# Patient Record
Sex: Male | Born: 1985 | Race: White | Hispanic: No | Marital: Single | Smoking: Current every day smoker
Health system: Southern US, Community
[De-identification: ages and names within clinical notes are randomized; demographics above are authoritative.]

## PROBLEM LIST (undated history)

## (undated) HISTORY — PX: TOOTH EXTRACTION: SUR596

---
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
## 2004-11-21 ENCOUNTER — Emergency Department (HOSPITAL_COMMUNITY): Admission: EM | Admit: 2004-11-21 | Discharge: 2004-11-21 | Payer: Self-pay | Admitting: Emergency Medicine

## 2005-03-15 ENCOUNTER — Emergency Department (HOSPITAL_COMMUNITY): Admission: EM | Admit: 2005-03-15 | Discharge: 2005-03-15 | Payer: Self-pay | Admitting: Emergency Medicine

## 2006-05-31 ENCOUNTER — Emergency Department (HOSPITAL_COMMUNITY): Admission: EM | Admit: 2006-05-31 | Discharge: 2006-05-31 | Payer: Self-pay | Admitting: Emergency Medicine

## 2010-02-26 ENCOUNTER — Emergency Department (HOSPITAL_COMMUNITY): Admission: EM | Admit: 2010-02-26 | Discharge: 2010-02-26 | Payer: Self-pay | Admitting: Emergency Medicine

## 2010-03-13 ENCOUNTER — Emergency Department (HOSPITAL_COMMUNITY): Admission: EM | Admit: 2010-03-13 | Discharge: 2010-03-13 | Payer: Self-pay | Admitting: Emergency Medicine

## 2010-04-02 ENCOUNTER — Emergency Department (HOSPITAL_COMMUNITY): Admission: EM | Admit: 2010-04-02 | Discharge: 2010-04-02 | Payer: Self-pay | Admitting: Emergency Medicine

## 2011-04-22 ENCOUNTER — Inpatient Hospital Stay (INDEPENDENT_AMBULATORY_CARE_PROVIDER_SITE_OTHER)
Admission: RE | Admit: 2011-04-22 | Discharge: 2011-04-22 | Disposition: A | Discharge status: Home / Self Care | Payer: Self-pay | Source: Ambulatory Visit | Attending: Emergency Medicine | Admitting: Emergency Medicine

## 2011-04-22 DIAGNOSIS — J020 Streptococcal pharyngitis: Secondary | ICD-10-CM

## 2011-04-22 LAB — POCT RAPID STREP A: Streptococcus, Group A Screen (Direct): POSITIVE — AB

## 2011-07-02 ENCOUNTER — Emergency Department (INDEPENDENT_AMBULATORY_CARE_PROVIDER_SITE_OTHER)
Admission: EM | Admit: 2011-07-02 | Discharge: 2011-07-02 | Disposition: A | Discharge status: Home / Self Care | Payer: BC Managed Care – PPO | Source: Home / Self Care | Attending: Emergency Medicine | Admitting: Emergency Medicine

## 2011-07-02 ENCOUNTER — Encounter: Payer: Self-pay | Admitting: Emergency Medicine

## 2011-07-02 DIAGNOSIS — R197 Diarrhea, unspecified: Secondary | ICD-10-CM

## 2011-07-02 LAB — DIFFERENTIAL
Basophils Absolute: 0 10*3/uL (ref 0.0–0.1)
Basophils Relative: 0 % (ref 0–1)
Eosinophils Absolute: 0.2 10*3/uL (ref 0.0–0.7)
Eosinophils Relative: 3 % (ref 0–5)

## 2011-07-02 LAB — CBC
MCH: 31.4 pg (ref 26.0–34.0)
MCV: 88.2 fL (ref 78.0–100.0)
Platelets: 221 10*3/uL (ref 150–400)
RDW: 12.7 % (ref 11.5–15.5)

## 2011-07-02 LAB — POCT I-STAT, CHEM 8
Hemoglobin: 17.3 g/dL — ABNORMAL HIGH (ref 13.0–17.0)
Sodium: 140 mEq/L (ref 135–145)
TCO2: 24 mmol/L (ref 0–100)

## 2011-07-02 MED ORDER — DIPHENOXYLATE-ATROPINE 2.5-0.025 MG PO TABS: 1.0000 | ORAL_TABLET | Freq: Four times a day (QID) | ORAL | Status: AC | PRN

## 2011-07-02 MED ORDER — ONDANSETRON HCL 4 MG PO TABS: 4.0000 mg | ORAL_TABLET | Freq: Three times a day (TID) | ORAL | Status: AC | PRN

## 2011-07-02 MED ORDER — ONDANSETRON 4 MG PO TBDP
ORAL_TABLET | ORAL | Status: AC
  Filled 2011-07-02: qty 1

## 2011-07-02 MED ORDER — ONDANSETRON HCL 4 MG PO TABS
4.0000 mg | ORAL_TABLET | Freq: Once | ORAL | Status: AC
  Administered 2011-07-02: 4 mg via ORAL

## 2011-07-02 NOTE — ED Provider Notes (Addendum)
History     CSN: [id number] Arrival date & time: 07/02/2011  2:17 PM   First MD Initiated Contact with Patient 07/02/11 1339      Chief Complaint  Patient presents with  . Fever    (Consider location/radiation/quality/duration/timing/severity/associated sxs/prior treatment) HPI Comments: For almost 6 days been nauseous with low grade fevers, vomited x 1, diarrheas more than 3 episodes of liquid diarrheas per day, and feeling nauseous  How many days, off Im a getting?  Patient is a 25 y.o. male presenting with diarrhea. The history is provided by the patient.  Diarrhea The primary symptoms include fever, abdominal pain, nausea and diarrhea. Primary symptoms do not include dysuria or rash. The illness began 3 to 5 days ago. The problem has been gradually improving.  The illness is also significant for anorexia.    History reviewed. No pertinent past medical history.  History reviewed. No pertinent past surgical history.  Family History  Problem Relation Age of Onset  . Diabetes Mother   . Cancer Mother   . Emphysema Father   . Cirrhosis Father     History  Substance Use Topics  . Smoking status: Current Everyday Smoker  . Smokeless tobacco: Not on file  . Alcohol Use: Yes     ocassionally      Review of Systems  Constitutional: Positive for fever.  Respiratory: Negative for cough.   Gastrointestinal: Positive for nausea, abdominal pain, diarrhea and anorexia.  Genitourinary: Negative for dysuria.  Skin: Negative for rash.    Allergies  Review of patient's allergies indicates no known allergies.  Home Medications   Current Outpatient Rx  Name Route Sig Dispense Refill  . ALEVE PO Oral Take 2 tablets by mouth 1 day or 1 dose.        BP 119/72  Pulse 90  Temp(Src) 97.8 F (36.6 C) (Oral)  Resp 20  SpO2 97%  Physical Exam  Constitutional: He appears well-developed and well-nourished.  Non-toxic appearance. He does not have a sickly appearance. He  does not appear ill. No distress.  HENT:  Mouth/Throat: Uvula is midline.  Eyes: No scleral icterus.  Neck: Normal range of motion.  Pulmonary/Chest: Effort normal.  Abdominal: Soft. Bowel sounds are normal. There is no tenderness. There is no rebound, no CVA tenderness, no tenderness at McBurney's point and negative Murphy's sign.  Skin: Skin is warm. No rash noted.    ED Course  Procedures (including critical care time)  Labs Reviewed  POCT I-STAT, CHEM 8 - Abnormal; Notable for the following:    Hemoglobin 17.3 (*)    All other components within normal limits  CBC  DIFFERENTIAL  I-STAT, CHEM 8   No results found.   No diagnosis found.    MDM  Viral syndrome-        Jimmie Molly, MD 07/02/11 1601  Jimmie Molly, MD 07/03/11 9528  Jimmie Molly, MD 07/03/11 [phone]

## 2011-07-02 NOTE — ED Notes (Signed)
Fever, intermittent, general aches and pain.  Reports chills, feeling bad, nausea, very rare vomiting, diarrhea.  3 diarrhea stools today, double that number yesterday. Denies vomiting today.

## 2012-04-25 ENCOUNTER — Emergency Department (HOSPITAL_COMMUNITY): Payer: BC Managed Care – PPO

## 2012-04-25 ENCOUNTER — Encounter (HOSPITAL_COMMUNITY): Payer: Self-pay | Admitting: Family Medicine

## 2012-04-25 ENCOUNTER — Emergency Department (HOSPITAL_COMMUNITY)
Admission: EM | Admit: 2012-04-25 | Discharge: 2012-04-25 | Disposition: A | Discharge status: Home Health | Payer: BC Managed Care – PPO | Attending: Emergency Medicine | Admitting: Emergency Medicine

## 2012-04-25 DIAGNOSIS — R079 Chest pain, unspecified: Secondary | ICD-10-CM

## 2012-04-25 DIAGNOSIS — F17200 Nicotine dependence, unspecified, uncomplicated: Secondary | ICD-10-CM | POA: Insufficient documentation

## 2012-04-25 DIAGNOSIS — J40 Bronchitis, not specified as acute or chronic: Secondary | ICD-10-CM | POA: Insufficient documentation

## 2012-04-25 LAB — CBC WITH DIFFERENTIAL/PLATELET
Eosinophils Absolute: 0.1 10*3/uL (ref 0.0–0.7)
Lymphocytes Relative: 8 % — ABNORMAL LOW (ref 12–46)
Lymphs Abs: 0.8 10*3/uL (ref 0.7–4.0)
MCH: 30.7 pg (ref 26.0–34.0)
Neutrophils Relative %: 84 % — ABNORMAL HIGH (ref 43–77)
Platelets: 223 10*3/uL (ref 150–400)
RBC: 5.08 MIL/uL (ref 4.22–5.81)
WBC: 10.1 10*3/uL (ref 4.0–10.5)

## 2012-04-25 LAB — POCT I-STAT, CHEM 8
BUN: 9 mg/dL (ref 6–23)
HCT: 48 % (ref 39.0–52.0)
Sodium: 140 mEq/L (ref 135–145)
TCO2: 24 mmol/L (ref 0–100)

## 2012-04-25 LAB — D-DIMER, QUANTITATIVE: D-Dimer, Quant: <0.27 ug/mL-FEU (ref 0.00–0.48)

## 2012-04-25 MED ORDER — ALBUTEROL SULFATE (5 MG/ML) 0.5% IN NEBU
5.0000 mg | INHALATION_SOLUTION | Freq: Once | RESPIRATORY_TRACT | Status: AC
  Administered 2012-04-25: 5 mg via RESPIRATORY_TRACT
  Filled 2012-04-25: qty 1

## 2012-04-25 MED ORDER — PREDNISONE 10 MG PO TABS: 40.0000 mg | ORAL_TABLET | Freq: Every day | ORAL | Status: DC

## 2012-04-25 MED ORDER — IPRATROPIUM BROMIDE 0.02 % IN SOLN
0.5000 mg | Freq: Once | RESPIRATORY_TRACT | Status: AC
  Administered 2012-04-25: 0.5 mg via RESPIRATORY_TRACT
  Filled 2012-04-25: qty 2.5

## 2012-04-25 MED ORDER — ALBUTEROL SULFATE HFA 108 (90 BASE) MCG/ACT IN AERS
2.0000 | INHALATION_SPRAY | Freq: Once | RESPIRATORY_TRACT | Status: AC
  Administered 2012-04-25: 2 via RESPIRATORY_TRACT
  Filled 2012-04-25: qty 6.7

## 2012-04-25 MED ORDER — IBUPROFEN 600 MG PO TABS: 600.0000 mg | ORAL_TABLET | Freq: Four times a day (QID) | ORAL | Status: AC | PRN

## 2012-04-25 MED ORDER — IBUPROFEN 600 MG PO TABS: 600.0000 mg | ORAL_TABLET | Freq: Four times a day (QID) | ORAL | Status: DC | PRN

## 2012-04-25 NOTE — ED Notes (Signed)
Pt sts SOB and hard to take a deep breath. sts chest and back pain. Denies cough or fever. No sure of any injury
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
## 2012-04-26 NOTE — ED Provider Notes (Signed)
Medical screening examination/treatment/procedure(s) were performed by non-physician practitioner and as supervising physician I was immediately available for consultation/collaboration.   Carleene Cooper III, MD 04/26/12 [phone]

## 2012-12-08 ENCOUNTER — Emergency Department (HOSPITAL_COMMUNITY)
Admission: EM | Admit: 2012-12-08 | Discharge: 2012-12-08 | Disposition: A | Discharge status: Home / Self Care | Payer: Worker's Compensation | Attending: Emergency Medicine | Admitting: Emergency Medicine

## 2012-12-08 ENCOUNTER — Emergency Department (HOSPITAL_COMMUNITY): Payer: Worker's Compensation

## 2012-12-08 ENCOUNTER — Encounter (HOSPITAL_COMMUNITY): Payer: Self-pay | Admitting: Emergency Medicine

## 2012-12-08 DIAGNOSIS — F17200 Nicotine dependence, unspecified, uncomplicated: Secondary | ICD-10-CM | POA: Insufficient documentation

## 2012-12-08 DIAGNOSIS — W298XXA Contact with other powered powered hand tools and household machinery, initial encounter: Secondary | ICD-10-CM | POA: Insufficient documentation

## 2012-12-08 DIAGNOSIS — Y9389 Activity, other specified: Secondary | ICD-10-CM | POA: Insufficient documentation

## 2012-12-08 DIAGNOSIS — S81012A Laceration without foreign body, left knee, initial encounter: Secondary | ICD-10-CM

## 2012-12-08 DIAGNOSIS — S81809A Unspecified open wound, unspecified lower leg, initial encounter: Secondary | ICD-10-CM | POA: Insufficient documentation

## 2012-12-08 DIAGNOSIS — S81009A Unspecified open wound, unspecified knee, initial encounter: Secondary | ICD-10-CM | POA: Insufficient documentation

## 2012-12-08 DIAGNOSIS — Y990 Civilian activity done for income or pay: Secondary | ICD-10-CM | POA: Insufficient documentation

## 2012-12-08 DIAGNOSIS — Y9289 Other specified places as the place of occurrence of the external cause: Secondary | ICD-10-CM | POA: Insufficient documentation

## 2012-12-08 MED ORDER — CEPHALEXIN 500 MG PO CAPS
500.0000 mg | ORAL_CAPSULE | Freq: Four times a day (QID) | ORAL | Status: DC
Start: 2012-12-08 — End: 2014-09-17

## 2012-12-08 MED ORDER — LIDOCAINE HCL (PF) 1 % IJ SOLN
10.0000 mL | Freq: Once | INTRAMUSCULAR | Status: DC
  Filled 2012-12-08: qty 10

## 2012-12-08 MED ORDER — HYDROCODONE-ACETAMINOPHEN 5-325 MG PO TABS: 1.0000 | ORAL_TABLET | ORAL | Status: DC | PRN

## 2012-12-08 MED ORDER — LIDOCAINE HCL 1 % IJ SOLN: 10.0000 mL | Freq: Once | INTRAMUSCULAR | Status: DC

## 2012-12-08 MED ORDER — TETANUS-DIPHTH-ACELL PERTUSSIS 5-2.5-18.5 LF-MCG/0.5 IM SUSP
0.5000 mL | Freq: Once | INTRAMUSCULAR | Status: AC
  Administered 2012-12-08: 0.5 mL via INTRAMUSCULAR
  Filled 2012-12-08: qty 0.5

## 2012-12-08 NOTE — ED Notes (Signed)
Pt was "cutting up a tree and the chainsaw kicked back and cut me" Bleeding controlled at this time.

## 2012-12-08 NOTE — ED Provider Notes (Signed)
History     CSN: [id number]  Arrival date & time 12/08/12  1659   First MD Initiated Contact with Patient 12/08/12 1721      Chief Complaint  Patient presents with  . Extremity Laceration    (Consider location/radiation/quality/duration/timing/severity/associated sxs/prior treatment) The history is provided by the patient and medical records. No language interpreter was used.    Zachary Lozano is a 27 y.o. male  with no medical Hx presents to the Emergency Department complaining of acute persistent laceration to the L knee while cutting a tree with a chainsaw.  Pt was at work when this happened at approx 4:50pm.  Pt put a tourniquet on his leg which stopped the bleeding.  Bending the knee makes the bleeding worse, but he denies pain in the extremity.  Pt was ambulatory without difficulty after the accident.  Associated symptoms include bleeding.  Denies fever, chills, headache, neck pain, numbness, tingling.  Pt last tetanus is unknown.    History reviewed. No pertinent past medical history.  Past Surgical History  Procedure Laterality Date  . Tooth extraction      Family History  Problem Relation Age of Onset  . Diabetes Mother   . Cancer Mother   . Emphysema Father   . Cirrhosis Father     History  Substance Use Topics  . Smoking status: Current Every Day Smoker -- 1.00 packs/day    Types: Cigarettes  . Smokeless tobacco: Not on file  . Alcohol Use: No     Comment: ocassionally      Review of Systems  Constitutional: Negative for fever.  HENT: Negative for neck pain and neck stiffness.   Musculoskeletal: Negative for back pain and gait problem.  Skin: Positive for wound.  Allergic/Immunologic: Negative for immunocompromised state.  Neurological: Negative for weakness and numbness.  Hematological: Does not bruise/bleed easily.  Psychiatric/Behavioral: The patient is not nervous/anxious.     Allergies  Review of patient's allergies indicates no known  allergies.  Home Medications   Current Outpatient Rx  Name  Route  Sig  Dispense  Refill  . ibuprofen (ADVIL,MOTRIN) 200 MG tablet   Oral   Take 200 mg by mouth every 6 (six) hours as needed for pain.         . cephALEXin (KEFLEX) 500 MG capsule   Oral   Take 1 capsule (500 mg total) by mouth 4 (four) times daily.   40 capsule   0   . HYDROcodone-acetaminophen (NORCO/VICODIN) 5-325 MG per tablet   Oral   Take 1 tablet by mouth every 4 (four) hours as needed for pain.   6 tablet   0     BP 121/67  Pulse 100  Temp(Src) 98.4 F (36.9 C) (Oral)  SpO2 97%  Physical Exam  Nursing note and vitals reviewed. Constitutional: He is oriented to person, place, and time. He appears well-developed and well-nourished. No distress.  HENT:  Head: Normocephalic and atraumatic.  Eyes: Conjunctivae are normal. No scleral icterus.  Neck: Normal range of motion.  Cardiovascular: Normal rate, regular rhythm, normal heart sounds and intact distal pulses.   No murmur heard. Capillary refill < 3 sec  Pulmonary/Chest: Effort normal and breath sounds normal. No respiratory distress. He has no wheezes.  Abdominal: Soft. He exhibits no distension. There is no tenderness.  Musculoskeletal: Normal range of motion. He exhibits no edema.  Full ROM of the L knee and LLE No evidence of patella tendon defect  Neurological: He is alert  and oriented to person, place, and time. He exhibits normal muscle tone. Coordination normal.  Sensation intact to dull and sharp Strength 5/5 in the bilateral lower extremities  Skin: Skin is warm and dry. He is not diaphoretic. No erythema.  10cm, Large laceration noted over the L knee with an accompanying smaller laceration   Psychiatric: He has a normal mood and affect.    ED Course  LACERATION REPAIR Date/Time: 12/08/2012 6:26 PM Performed by: Dierdre Forth Authorized by: Dierdre Forth Consent: Verbal consent obtained. Risks and benefits:  risks, benefits and alternatives were discussed Consent given by: patient Patient understanding: patient states understanding of the procedure being performed Patient consent: the patient's understanding of the procedure matches consent given Procedure consent: procedure consent matches procedure scheduled Relevant documents: relevant documents present and verified Site marked: the operative site was marked Required items: required blood products, implants, devices, and special equipment available Patient identity confirmed: verbally with patient and arm band Time out: Immediately prior to procedure a "time out" was called to verify the correct patient, procedure, equipment, support staff and site/side marked as required. Body area: lower extremity Location details: left knee Laceration length: 10 cm Contamination: The wound is contaminated. Foreign bodies: wood Tendon involvement: none Nerve involvement: none Vascular damage: no Anesthesia: local infiltration Local anesthetic: lidocaine 1% without epinephrine Anesthetic total: 12 ml Patient sedated: no Preparation: Patient was prepped and draped in the usual sterile fashion. Irrigation solution: saline Irrigation method: syringe and jet lavage Amount of cleaning: extensive Debridement: none Degree of undermining: none Skin closure: 3-0 Prolene Number of sutures: 10 (7 horizontal mattress and 3 simple interrupted) Technique: simple and horizontal mattress Approximation: close Approximation difficulty: complex Dressing: 4x4 sterile gauze Patient tolerance: Patient tolerated the procedure well with no immediate complications.   (including critical care time)  Labs Reviewed - No data to display Dg Knee Complete 4 Views Left  12/08/2012  *RADIOLOGY REPORT*  Clinical Data: Chainsaw laceration.  LEFT KNEE - COMPLETE 4+ VIEW  Comparison: None.  Findings: There is a laceration involving the ventral aspect of the knee near the inferior  pole of the patella.  No obvious fracture. The patellar tendon appears intact.  No joint effusion.  IMPRESSION: Deep laceration but no plain film findings for patellar fracture or patellar tendon disruption.   Original Report Authenticated By: Rudie Meyer, M.D.      1. Laceration of knee, left, complicated, initial encounter       MDM  [name] presents after laceration from chainsaw accident.  Tdap booster given. No fracture noted on x-ray.  I personally reviewed the imaging tests through PACS system.  I reviewed available ER/hospitalization records through the EMR.  Pressure irrigation performed. Laceration occurred < 8 hours prior to repair which was well tolerated. Pt has no co morbidities to effect normal wound healing. Discussed suture home care w pt and answered questions. Pt placed in knee immobilizer to decrease ROM of knee and keep stitches intact.   Pt to f-u for wound check and suture removal in 7 days. Pt is hemodynamically stable w no complaints prior to dc.           Dahlia Client Wandalene Abrams, PA-C 12/08/12 1932

## 2012-12-08 NOTE — ED Provider Notes (Signed)
Medical screening examination/treatment/procedure(s) were performed by non-physician practitioner and as supervising physician I was immediately available for consultation/collaboration.   [place] Joselle Deeds, MD 12/08/12 2154 

## 2014-09-17 ENCOUNTER — Encounter (HOSPITAL_COMMUNITY): Payer: Self-pay | Admitting: Emergency Medicine

## 2014-09-17 ENCOUNTER — Emergency Department (INDEPENDENT_AMBULATORY_CARE_PROVIDER_SITE_OTHER)
Admission: EM | Admit: 2014-09-17 | Discharge: 2014-09-17 | Disposition: A | Discharge status: Home / Self Care | Payer: BLUE CROSS/BLUE SHIELD | Source: Home / Self Care | Attending: Emergency Medicine | Admitting: Emergency Medicine

## 2014-09-17 DIAGNOSIS — K529 Noninfective gastroenteritis and colitis, unspecified: Secondary | ICD-10-CM

## 2014-09-17 LAB — POCT I-STAT, CHEM 8
BUN: 11 mg/dL (ref 6–23)
CREATININE: 0.8 mg/dL (ref 0.50–1.35)
Calcium, Ion: 1.21 mmol/L (ref 1.12–1.23)
Chloride: 106 mmol/L (ref 96–112)
GLUCOSE: 98 mg/dL (ref 70–99)
HEMATOCRIT: 50 % (ref 39.0–52.0)
HEMOGLOBIN: 17 g/dL (ref 13.0–17.0)
POTASSIUM: 4.1 mmol/L (ref 3.5–5.1)
SODIUM: 141 mmol/L (ref 135–145)
TCO2: 19 mmol/L (ref 0–100)

## 2014-09-17 MED ORDER — ONDANSETRON HCL 4 MG PO TABS: 4.0000 mg | ORAL_TABLET | Freq: Three times a day (TID) | ORAL | Status: DC | PRN

## 2014-09-17 MED ORDER — LOPERAMIDE HCL 2 MG PO CAPS: 2.0000 mg | ORAL_CAPSULE | Freq: Four times a day (QID) | ORAL | Status: DC | PRN

## 2014-09-17 NOTE — ED Notes (Signed)
Pt states that he has had episodes on and off of nausea and diarrhea for a few weeks now along with fatigue.

## 2014-09-17 NOTE — ED Provider Notes (Signed)
CSN: [id number]     Arrival date & time 09/17/14  1809 History   First MD Initiated Contact with Patient 09/17/14 1914     Chief Complaint  Patient presents with  . Emesis  . Diarrhea   (Consider location/radiation/quality/duration/timing/severity/associated sxs/prior Treatment) HPI He is a 29 year old man here for evaluation of vomiting and diarrhea. He states for the last month he has had recurrent episodes of nausea, vomiting, crampy abdominal pain, diarrhea. It will temporarily improved, but then recurred. Today, he reports a small amount of bright red blood in the stool. This is associated with some discomfort with bowel movements. He denies any fevers or chills. He describes the pain as crampy pain, primarily on the left side. He also reports decreased appetite and fatigue. Sometimes he will have bloating associated with these episodes. They are not associated with any specific foods. No family history of ulcerative colitis or Crohn's. No history of colon cancer. He is a smoker. He denies alcohol use.  History reviewed. No pertinent past medical history. Past Surgical History  Procedure Laterality Date  . Tooth extraction     Family History  Problem Relation Age of Onset  . Diabetes Mother   . Cancer Mother   . Emphysema Father   . Cirrhosis Father    History  Substance Use Topics  . Smoking status: Current Every Day Smoker -- 1.00 packs/day    Types: Cigarettes  . Smokeless tobacco: Not on file  . Alcohol Use: No     Comment: ocassionally    Review of Systems  Constitutional: Positive for fever and appetite change. Negative for chills.  Gastrointestinal: Positive for nausea, vomiting, abdominal pain, diarrhea and blood in stool.  Musculoskeletal: Negative for myalgias and arthralgias.  Skin: Negative for rash.    Allergies  Review of patient's allergies indicates no known allergies.  Home Medications   Prior to Admission medications   Medication Sig Start Date End  Date Taking? Authorizing Provider  loperamide (IMODIUM) 2 MG capsule Take 1 capsule (2 mg total) by mouth 4 (four) times daily as needed for diarrhea or loose stools. 09/17/14   Charm RingsErin J Lux Meaders, MD  ondansetron (ZOFRAN) 4 MG tablet Take 1 tablet (4 mg total) by mouth every 8 (eight) hours as needed for nausea or vomiting. 09/17/14   Charm RingsErin J Azazel Franze, MD   BP 116/74 mmHg  Pulse 89  Temp(Src) 98.7 F (37.1 C) (Oral)  Resp 18  SpO2 97% Physical Exam  Constitutional: He is oriented to person, place, and time. He appears well-developed and well-nourished. No distress.  HENT:  Mouth/Throat: Oropharynx is clear and moist.  Eyes: Conjunctivae are normal.  Neck: Neck supple.  Cardiovascular: Normal rate, regular rhythm and normal heart sounds.   No murmur heard. Pulmonary/Chest: Effort normal and breath sounds normal. No respiratory distress. He has no wheezes. He has no rales.  Abdominal: Soft. Bowel sounds are normal. He exhibits no distension. There is tenderness (on the left). There is no rebound and no guarding.  Genitourinary: Rectal exam shows external hemorrhoid. Rectal exam shows no fissure, no mass, no tenderness and anal tone normal. Guaiac negative stool.     Neurological: He is alert and oriented to person, place, and time.  Skin: No rash noted.    ED Course  Procedures (including critical care time) Labs Review Labs Reviewed  POCT I-STAT, CHEM 8    Imaging Review No results found.   MDM   1. Gastroenteritis    Fecal occult test is  negative.  Unclear cause of diarrhea. We'll treat symptomatically with Zofran and Imodium. Recommended follow-up with gastroenterology if symptoms persist. Reviewed reasons to go to the ER as in after visit summary.   Charm Rings, MD 09/17/14 2040

## 2014-09-17 NOTE — Discharge Instructions (Signed)
I am not sure what is causing your diarrhea. It may be recurrent viral infections. Take Zofran every 8 hours as needed for nausea and vomiting. You can take Imodium 4 times a day as needed for diarrhea. If your symptoms have not resolved in the next week, please follow-up with gastroenterology. A few see blood in your stool or vomit, or are unable to keep fluids down, or develop fevers, please go to the emergency room.

## 2014-10-24 ENCOUNTER — Encounter (HOSPITAL_COMMUNITY): Payer: Self-pay | Admitting: *Deleted

## 2014-10-24 ENCOUNTER — Emergency Department (HOSPITAL_COMMUNITY)
Admission: EM | Admit: 2014-10-24 | Discharge: 2014-10-24 | Disposition: A | Discharge status: Home / Self Care | Payer: BLUE CROSS/BLUE SHIELD | Attending: Emergency Medicine | Admitting: Emergency Medicine

## 2014-10-24 DIAGNOSIS — M542 Cervicalgia: Secondary | ICD-10-CM | POA: Diagnosis not present

## 2014-10-24 DIAGNOSIS — Z720 Tobacco use: Secondary | ICD-10-CM | POA: Diagnosis not present

## 2014-10-24 DIAGNOSIS — M545 Low back pain: Secondary | ICD-10-CM | POA: Insufficient documentation
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
## 2014-10-24 NOTE — Discharge Instructions (Signed)
Back Exercises °Back exercises help treat and prevent back injuries. The goal is to increase your strength in your belly (abdominal) and back muscles. These exercises can also help with flexibility. Start these exercises when told by your doctor. °HOME CARE °Back exercises include: °Pelvic Tilt. °· Lie on your back with your knees bent. Tilt your pelvis until the lower part of your back is against the floor. Hold this position 5 to 10 sec. Repeat this exercise 5 to 10 times. °Knee to Chest. °· Pull 1 knee up against your chest and hold for 20 to 30 seconds. Repeat this with the other knee. This may be done with the other leg straight or bent, whichever feels better. Then, pull both knees up against your chest. °Sit-Ups or Curl-Ups. °· Bend your knees 90 degrees. Start with tilting your pelvis, and do a partial, slow sit-up. Only lift your upper half 30 to 45 degrees off the floor. Take at least 2 to 3 seonds for each sit-up. Do not do sit-ups with your knees out straight. If partial sit-ups are difficult, simply do the above but with only tightening your belly (abdominal) muscles and holding it as told. °Hip-Lift. °· Lie on your back with your knees flexed 90 degrees. Push down with your feet and shoulders as you raise your hips 2 inches off the floor. Hold for 10 seconds, repeat 5 to 10 times. °Back Arches. °· Lie on your stomach. Prop yourself up on bent elbows. Slowly press on your hands, causing an arch in your low back. Repeat 3 to 5 times. °Shoulder-Lifts. °· Lie face down with arms beside your body. Keep hips and belly pressed to floor as you slowly lift your head and shoulders off the floor. °Do not overdo your exercises. Be careful in the beginning. Exercises may cause you some mild back discomfort. If the pain lasts for more than 15 minutes, stop the exercises until you see your doctor. Improvement with exercise for back problems is slow.  °Document Released: 09/05/2010 Document Revised: 10/26/2011  Document Reviewed: 06/04/2011 °ExitCare® Patient Information ©2015 [employer]. This information is not intended to replace advice given to you by your health care provider. Make sure you discuss any questions you have with your health care provider. ° °Emergency Department Resource Guide °1) Find a Doctor and Pay Out of Pocket °Although you won't have to find out who is covered by your insurance plan, it is a good idea to ask around and get recommendations. You will then need to call the office and see if the doctor you have chosen will accept you as a new patient and what types of options they offer for patients who are self-pay. Some doctors offer discounts or will set up payment plans for their patients who do not have insurance, but you will need to ask so you aren't surprised when you get to your appointment. ° °2) Contact Your Local Health Department °Not all health departments have doctors that can see patients for sick visits, but many do, so it is worth a call to see if yours does. If you don't know where your local health department is, you can check in your phone book. The CDC also has a tool to help you locate your state's health department, and many state websites also have listings of all of their local health departments. ° °3) Find a Walk-in Clinic °If your illness is not likely to be very severe or complicated, you may want to try a walk in   clinic. These are popping up all over the country in pharmacies, drugstores, and shopping centers. They're usually staffed by nurse practitioners or physician assistants that have been trained to treat common illnesses and complaints. They're usually fairly quick and inexpensive. However, if you have serious medical issues or chronic medical problems, these are probably not your best option. ° °No Primary Care Doctor: °- Call [facility] at  832-8000 - they can help you locate a primary care doctor that  accepts your insurance, provides certain services,  etc. °- Physician Referral Service- 1-800-533-3463 ° °Chronic Pain Problems: °Organization         Address  Phone   Notes  °[facility]  (336) 297-2271 Patients need to be referred by their primary care doctor.  ° °Medication Assistance: °Organization         Address  Phone   Notes  °[place] [facility] [address] °[place], [place] [address] (336) 641-8030 --Must be a resident of [place] °-- Must have NO insurance coverage whatsoever (no Medicaid/ Medicare, etc.) °-- The pt. MUST have a primary care doctor that directs their care regularly and follows them in the community °  °[facility]  (866) 331-1348   °[employer]  (888) 892-1162   ° °Agencies that provide inexpensive medical care: °Organization         Address  Phone   Notes  °[facility]  (336) 832-8035   °[facility]    (336) 832-7272   °[facility] [address] °[place], [place] [address] (336) 832-4777   °[facility] [address], °[place] (336) 271-4999   °[facility]    (336) 373-0678   °[facility]    (336) 272-1050   °[facility] ° [address], [place] Phone:  (336) 832-4444, Fax:  (336) 832-4440 Hours of Operation:  9 am - 6 pm, M-F.  Also accepts Medicaid/Medicare and self-pay.  °[facility] ° [address], [place] Phone: (336) 832-3150, Fax: (336) 832-3151. Hours of Operation:  8:30 am - 5:30 pm, M-F.  Also accepts Medicaid and self-pay.  °[facility] [place] [address], [place] Phone: (336) 878-6027   °[facility] [address], [place], [place] (336)723-1848, Ext. 123 Mondays & Thursdays: 7-9 AM.  First 15 patients are seen on a first come, first serve basis. °  ° °Medicaid-accepting [place] Providers: ° °Organization         Address  Phone   Notes  °[facility] [address], [place] (336) 641-2100 Also accepts self-pay patients.  °[facility] [address], [place] ° (336) 856-9996   °[facility] [address], [place] (336) 288-8857   °[facility] [address] [place] [address], [place] (336) 299-7000   °Veita Bland [address], [place]  ° (336) 373-1557 Only accepts [place] Access Medicaid patients after they have their name applied to their card.  ° °Self-Pay (no insurance) in [place]: ° °Organization         Address  Phone   Notes  °Sickle Cell Patients, [facility] [address], [place] (336) 832-1970   °[facility] [address],  (336) 832-4400   °[facility] ° [address], [place] (336) 992-4800   °[facility]/Dr. Osei-Bonsu ° [address] [place] [address], [place] or [address], [place] (336) 841-8500 Phone number for both [place] and [place] locations is the same.  °[facility] [address], [place] (336) 299-0000   °[facility] [address] [place] [address], [place] or [address] (336) 852-7530 °(336) 878-2260   °[facility] [address], [place] (336) 350-1642, phone; (336) 294-5005, fax Sees patients 1st and 3rd Saturday of every month.  Must not qualify for public or private insurance (i.e. Medicaid, Medicare, [place] Health Choice, Veterans' Benefits) • Household income should be no more than 200% of the poverty level •The clinic cannot treat you if you are pregnant or think you are pregnant • Sexually transmitted diseases are not treated at the clinic.  ° ° °Dental Care: °Organization         Address  Phone  Notes  °[facility] [address], [place] (336) 641-6152 Accepts children up to  age 21 who are enrolled in Medicaid or [place] Health Choice; pregnant women with a Medicaid card; and children who have applied for Medicaid or [place] Health Choice, but were declined, whose parents can pay a reduced fee at time of service.  °[facility] [place]  [address], [place] (336) 641-7733 Accepts children up to age 21 who are enrolled in Medicaid or [place] Health Choice; pregnant women with a Medicaid card; and children who have applied for Medicaid or [place] Health Choice, but were declined, whose parents can pay a reduced fee at time of service.  °[facility] ° [address],  (336) 641-4533 Patients are seen by appointment only. Walk-ins are not accepted. [facility] will see patients 18 years of age and older. °Monday - Tuesday (8am-5pm) °Most Wednesdays (8:30-5pm) °$30 per visit, cash only  °[facility] ° [address], [place] (336) 641-4533 Patients are seen by appointment only. Walk-ins are not accepted. [facility] will see patients 18 years of age and older. °One Wednesday Evening (Monthly: Volunteer Based).  $30 per visit, cash only  °[facility]  (919) 537-3737 for adults; Children under age 4, call Graduate Pediatric Dentistry at (919) [phone]. Children aged 4-14, please call (919) [phone] to request a pediatric application. ° Dental services are provided in all areas of dental care including fillings, crowns and bridges, complete and partial dentures, implants, gum treatment, root canals, and extractions. Preventive care is also provided. Treatment is provided to both adults and children. °Patients are selected via a lottery and there is often a waiting list. °  °[facility] [address], ° ° (336) 763-8833 [email or web address] °  °[facility] [address], [place], [place] (336)723-1848, Ext. 123 Second and Fourth Thursday of  each month, opens at 6:30 AM; Clinic ends at 9 AM.  Patients are seen on a first-come first-served basis, and a limited number are seen during each clinic.  ° °[facility] ° [address], [place], [place] (336) 723-7904   Eligibility Requirements °You must have lived in [place], [place], or [place] counties for at least the last three months. °  You cannot be eligible for state or federal sponsored healthcare insurance, including   Veterans Administration, Medicaid, or Medicare. °  You generally cannot be eligible for healthcare insurance through your employer.  °  How to apply: °Eligibility screenings are held every Tuesday and Wednesday afternoon from 1:00 pm until 4:00 pm. You do not need an appointment for the interview!  °[facility] [address], [place], [place] 336-631-2330   °[facility]  336-342-8273   °[facility]  336-703-3100   °[facility]  336-570-6415   ° °Behavioral Health Resources in the Community: °Intensive Outpatient Programs °Organization         Address  Phone  Notes  °[facility] [address], [place], [place] 336-878-6098   °[facility] [address], [place], [place] 336-832-9800   °[facility] [address], [place], [place] ° 336-882-2125   °[facility] [address],  °[place], [place] 1-800-853-5163 or 336-641-4981   °Substance Abuse Resources °Organization         Address  Phone  Notes  °[facility]  336-882-2125   °[facility]  336-784-9470   °[facility]  336-285-9073   °[facility]  336-845-3988   °Residential & Outpatient Substance Abuse Program  1-800-659-3381   °Psychological Services °Organization         Address  Phone  Notes  °[facility]  336- 832-9600   °[facility]  336- 378-7881   °[facility] [address],  [place] 1-800-853-5163 or 336-641-4981   ° °Mobile Crisis Teams °Organization         Address  Phone  Notes  °[facility], Mobile Crisis Care Unit  1-877-626-1772   °Assertive °[facility] ° [address] [place], [place] 336-834-9664   °Sharon DeEsch [address] °[place] 336-554-5454   ° °Self-Help/Support Groups °Organization         Address  Phone             Notes  °[facility] - variety of support groups  336- 373-1402 Call for more information  °Narcotics Anonymous (NA), [facility] [address], °[place]  2 meetings at this location  ° °Residential Treatment Programs °Organization         Address  Phone  Notes  °[facility] [address],    °[place]  1-866-801-8205   °[facility] ° [address], [place], [place] 704-293-8524   °[facility] [address], [place] 336-845-3988 Admissions: 8am-3pm M-F  °[facility] [address]    °[place], [place] 336-841-1104   °[facility] [address], [place], [place] 336-379-7146   °[facility] [address]  °[place], [place] 336-285-9073   °[facility] - Intensive Outpatient [address], [place], [place] 336-852-3033   °[facility] [address]  °[place], [place] 1-877-615-2722 or 336-784-9470   °[facility] [address] [place],  336-227-7417 Accepts Medicaid  °[facility] [address]  °[place]  1-800-659-3381 Substance Abuse/Addiction Treatment  ° °[place] Behavioral Health Resources °Organization         Address  Phone  Notes  °[facility]  (888) 581-9988   °Julie Brannon, PhD [address] [place],    (336) 349-5553 or (336) 951-0000   °  [facility]   [address] °[place], [place] (336) 349-4454     °[facility] [address], [place], [place] (336) 342-8316 Insurance/Medicaid/sponsorship through [facility]  °[facility] [address]                                    [place], [place] (336) 342-8316 Therapy/tele-psych/case  °[facility] [address]  ° [place], [place] (336) 349-2233    °Dr. Arfeen  (336) 349-4544   °[facility] [place]  [employer] [place] [facility] 1) [address],  °2) [address], [place] °3)  [address], [place] (336) 349-3220 °(336) 342-7768 ° °(336) 342-8140   °[place] [facility] (336) 342-1394 or (336) 342-3537 (After Hours)    ° ° °

## 2014-10-24 NOTE — ED Notes (Signed)
Pt made aware to return if symptoms worsen or if any life threatening symptoms occur.   
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
## 2014-12-11 ENCOUNTER — Other Ambulatory Visit: Payer: Self-pay | Admitting: Orthopedic Surgery

## 2014-12-11 DIAGNOSIS — M545 Low back pain: Secondary | ICD-10-CM

## 2014-12-11 DIAGNOSIS — M542 Cervicalgia: Secondary | ICD-10-CM

## 2014-12-16 ENCOUNTER — Other Ambulatory Visit: Payer: Self-pay

## 2014-12-26 ENCOUNTER — Ambulatory Visit
Admission: RE | Admit: 2014-12-26 | Discharge: 2014-12-26 | Disposition: A | Discharge status: Home / Self Care | Payer: BLUE CROSS/BLUE SHIELD | Source: Ambulatory Visit | Attending: Orthopedic Surgery | Admitting: Orthopedic Surgery

## 2014-12-26 ENCOUNTER — Other Ambulatory Visit: Payer: Self-pay

## 2014-12-26 DIAGNOSIS — M542 Cervicalgia: Secondary | ICD-10-CM

## 2014-12-26 DIAGNOSIS — M545 Low back pain: Secondary | ICD-10-CM

## 2015-06-12 ENCOUNTER — Other Ambulatory Visit: Payer: Self-pay | Admitting: Family Medicine

## 2015-06-12 NOTE — Telephone Encounter (Signed)
Wrong patient

## 2015-08-28 ENCOUNTER — Encounter (HOSPITAL_COMMUNITY): Payer: Self-pay | Admitting: *Deleted

## 2015-08-28 ENCOUNTER — Emergency Department (HOSPITAL_COMMUNITY)
Admission: EM | Admit: 2015-08-28 | Discharge: 2015-08-28 | Disposition: A | Discharge status: Home / Self Care | Payer: BLUE CROSS/BLUE SHIELD | Attending: Emergency Medicine | Admitting: Emergency Medicine

## 2015-08-28 DIAGNOSIS — J069 Acute upper respiratory infection, unspecified: Secondary | ICD-10-CM

## 2015-08-28 DIAGNOSIS — F17210 Nicotine dependence, cigarettes, uncomplicated: Secondary | ICD-10-CM | POA: Insufficient documentation

## 2015-08-28 DIAGNOSIS — B9789 Other viral agents as the cause of diseases classified elsewhere: Secondary | ICD-10-CM

## 2015-08-28 DIAGNOSIS — J029 Acute pharyngitis, unspecified: Secondary | ICD-10-CM | POA: Diagnosis present

## 2015-08-28 DIAGNOSIS — H6593 Unspecified nonsuppurative otitis media, bilateral: Secondary | ICD-10-CM | POA: Diagnosis not present

## 2015-08-28 DIAGNOSIS — J028 Acute pharyngitis due to other specified organisms: Secondary | ICD-10-CM

## 2015-08-28 LAB — RAPID STREP SCREEN (MED CTR MEBANE ONLY): Streptococcus, Group A Screen (Direct): NEGATIVE

## 2015-08-28 MED ORDER — NAPROXEN 250 MG PO TABS: 250.0000 mg | ORAL_TABLET | Freq: Two times a day (BID) | ORAL | Status: DC

## 2015-08-28 MED ORDER — BENZONATATE 100 MG PO CAPS: 100.0000 mg | ORAL_CAPSULE | Freq: Two times a day (BID) | ORAL | Status: DC | PRN

## 2015-08-28 MED ORDER — CETIRIZINE HCL 10 MG PO TABS: 10.0000 mg | ORAL_TABLET | Freq: Every day | ORAL | Status: DC

## 2015-08-28 MED ORDER — FLUTICASONE PROPIONATE 50 MCG/ACT NA SUSP: 2.0000 | Freq: Every day | NASAL | Status: DC

## 2015-08-28 NOTE — ED Notes (Signed)
Declined W/C at D/C and was escorted to lobby by RN. 

## 2015-08-28 NOTE — ED Provider Notes (Signed)
CSN: [id number]     Arrival date & time 08/28/15  [id number] History  By signing my name below, I, Zachary Lozano, attest that this documentation has been prepared under the direction and in the presence of [name], PA-C. Electronically Signed: Angelene GiovanniEmmanuella Lozano, ED Scribe. 08/28/2015. 11:20 AM.    Chief Complaint  Patient presents with  . Sore Throat   The history is provided by the patient. No language interpreter was used.   HPI Comments: Zachary SilosDavid C Lozano is a 30 y.o. male who presents to the Emergency Department complaining of gradually worsening moderate sore throat onset 4 days ago. He reports associated sneezing, nasal congestion, non-productive cough, pressure ear pain, mild generalized body aches. He states that he took OTC cold medication with no relief. He denies any fever, chills, or n/v.    History reviewed. No pertinent past medical history. Past Surgical History  Procedure Laterality Date  . Tooth extraction     Family History  Problem Relation Age of Onset  . Diabetes Mother   . Cancer Mother   . Emphysema Father   . Cirrhosis Father    Social History  Substance Use Topics  . Smoking status: Current Every Day Smoker -- 1.00 packs/day    Types: Cigarettes  . Smokeless tobacco: None  . Alcohol Use: No     Comment: ocassionally    Review of Systems  Constitutional: Negative for fever and chills.  HENT: Positive for congestion, ear pain, postnasal drip, rhinorrhea, sneezing and sore throat. Negative for drooling, ear discharge, mouth sores and trouble swallowing.   Eyes: Negative for redness and visual disturbance.  Respiratory: Positive for cough. Negative for shortness of breath and wheezing.   Gastrointestinal: Negative for nausea, vomiting and abdominal pain.  Musculoskeletal: Positive for myalgias (mild). Negative for neck pain and neck stiffness.  Skin: Negative for rash and wound.  Neurological: Negative for light-headedness.      Allergies  Review  of patient's allergies indicates no known allergies.  Home Medications   Prior to Admission medications   Medication Sig Start Date End Date Taking? Authorizing Provider  benzonatate (TESSALON) 100 MG capsule Take 1 capsule (100 mg total) by mouth 2 (two) times daily as needed for cough. 08/28/15   Everlene FarrierWilliam Briscoe Daniello, PA-C  cetirizine (ZYRTEC ALLERGY) 10 MG tablet Take 1 tablet (10 mg total) by mouth daily. 08/28/15   Everlene FarrierWilliam Conlin Brahm, PA-C  fluticasone (FLONASE) 50 MCG/ACT nasal spray Place 2 sprays into both nostrils daily. 08/28/15   Everlene FarrierWilliam Lyfe Reihl, PA-C  loperamide (IMODIUM) 2 MG capsule Take 1 capsule (2 mg total) by mouth 4 (four) times daily as needed for diarrhea or loose stools. 09/17/14   Charm RingsErin J Honig, MD  naproxen (NAPROSYN) 250 MG tablet Take 1 tablet (250 mg total) by mouth 2 (two) times daily with a meal. 08/28/15   Everlene FarrierWilliam Keniyah Gelinas, PA-C  ondansetron (ZOFRAN) 4 MG tablet Take 1 tablet (4 mg total) by mouth every 8 (eight) hours as needed for nausea or vomiting. 09/17/14   Charm RingsErin J Honig, MD   BP 123/85 mmHg  Pulse 92  Temp(Src) 98.1 F (36.7 C) (Oral)  Resp 20  SpO2 95% Physical Exam  Constitutional: He appears well-developed and well-nourished. No distress.  Nontoxic-appearing.  HENT:  Head: Normocephalic and atraumatic.  Right Ear: External ear normal.  Left Ear: External ear normal.  Mouth/Throat: Oropharynx is clear and moist. No oropharyngeal exudate.  Mild middle ear effusion bilaterally No TM erythema or loss of landmarks  Boggy nasal  turbinates bilaterally.  No tonsillar hypertrophy or exudates No trismus, tongue protusion normal.   Eyes: Conjunctivae and EOM are normal. Pupils are equal, round, and reactive to light. Right eye exhibits no discharge. Left eye exhibits no discharge.  Neck: Normal range of motion. Neck supple. No JVD present. No tracheal deviation present.  Cardiovascular: Normal rate, regular rhythm, normal heart sounds and intact distal pulses.    Pulmonary/Chest: Effort normal and breath sounds normal. No respiratory distress. He has no wheezes. He has no rales.  Lungs are clear to auscultation   Abdominal: Soft. There is no tenderness. There is no guarding.  Lymphadenopathy:    He has no cervical adenopathy.  Neurological: He is alert. Coordination normal.  Skin: Skin is warm and dry. No rash noted. He is not diaphoretic. No erythema. No pallor.  Psychiatric: He has a normal mood and affect. His behavior is normal.  Nursing note and vitals reviewed.   ED Course  Procedures (including critical care time) DIAGNOSTIC STUDIES: Oxygen Saturation is 95% on RA, adequate by my interpretation.    COORDINATION OF CARE: 11:17 AM- Pt advised of plan for treatment and pt agrees. Pt will receive a nasal spray for relief within 3 days to one week. He will also be placed on a non-drowsy antihistamine and a cough medication. Explained that his symptoms do not warrant and x-ray and he will not be placed on antibiotics because of his negative rapid strep test.    Labs Review Labs Reviewed  RAPID STREP SCREEN (NOT AT [facility])  CULTURE, GROUP A STREP [facility])    Everlene Farrier, PA-C has personally reviewed and evaluated these lab results as part of his medical decision-making.  MDM   Meds given in ED:  Medications - No data to display  New Prescriptions   BENZONATATE (TESSALON) 100 MG CAPSULE    Take 1 capsule (100 mg total) by mouth 2 (two) times daily as needed for cough.   CETIRIZINE (ZYRTEC ALLERGY) 10 MG TABLET    Take 1 tablet (10 mg total) by mouth daily.   FLUTICASONE (FLONASE) 50 MCG/ACT NASAL SPRAY    Place 2 sprays into both nostrils daily.   NAPROXEN (NAPROSYN) 250 MG TABLET    Take 1 tablet (250 mg total) by mouth 2 (two) times daily with a meal.    Final diagnoses:  URI (upper respiratory infection)  Sore throat (viral)   This is a 30 y.o. male who presents to the Emergency Department complaining of gradually worsening  moderate sore throat onset 4 days ago. He reports associated sneezing, nasal congestion, non-productive cough, pressure ear pain, mild generalized body aches.  On exam the patient is afebrile nontoxic appearing. His throat is clear. No tonsillar hypertrophy or exudates. No peritonsillar abscess. No trismus or drooling. Uvula is midline without edema. Lungs are clear to auscultation bilaterally. Rapid strep is negative. Patient with upper respiratory infection. We'll discharge her prescriptions for Tessalon Perles, Zyrtec, Flonase and naproxen. I encouraged close follow-up by his primary care provider. I advised the patient to follow-up with their primary care provider this week. I advised the patient to return to the emergency department with new or worsening symptoms or new concerns. The patient verbalized understanding and agreement with plan.    I personally performed the services described in this documentation, which was scribed in my presence. The recorded information has been reviewed and is accurate.    Everlene Farrier, PA-C 08/28/15 1124  Blane Ohara, MD 08/31/15 1153

## 2015-08-28 NOTE — Discharge Instructions (Signed)
Upper Respiratory Infection, Adult  Most upper respiratory infections (URIs) are a viral infection of the air passages leading to the lungs. A URI affects the nose, throat, and upper air passages. The most common type of URI is nasopharyngitis and is typically referred to as "the common cold."  URIs run their course and usually go away on their own. Most of the time, a URI does not require medical attention, but sometimes a bacterial infection in the upper airways can follow a viral infection. This is called a secondary infection. Sinus and middle ear infections are common types of secondary upper respiratory infections.  Bacterial pneumonia can also complicate a URI. A URI can worsen asthma and chronic obstructive pulmonary disease (COPD). Sometimes, these complications can require emergency medical care and may be life threatening.   CAUSES  Almost all URIs are caused by viruses. A virus is a type of germ and can spread from one person to another.   RISKS FACTORS  You may be at risk for a URI if:    You smoke.    You have chronic heart or lung disease.   You have a weakened defense (immune) system.    You are very young or very old.    You have nasal allergies or asthma.   You work in crowded or poorly ventilated areas.   You work in health care facilities or schools.  SIGNS AND SYMPTOMS   Symptoms typically develop 2-3 days after you come in contact with a cold virus. Most viral URIs last 7-10 days. However, viral URIs from the influenza virus (flu virus) can last 14-18 days and are typically more severe. Symptoms may include:    Runny or stuffy (congested) nose.    Sneezing.    Cough.    Sore throat.    Headache.    Fatigue.    Fever.    Loss of appetite.    Pain in your forehead, behind your eyes, and over your cheekbones (sinus pain).   Muscle aches.   DIAGNOSIS   Your health care provider may diagnose a URI by:   Physical exam.   Tests to check that your symptoms are not due to  another condition such as:   Strep throat.   Sinusitis.   Pneumonia.   Asthma.  TREATMENT   A URI goes away on its own with time. It cannot be cured with medicines, but medicines may be prescribed or recommended to relieve symptoms. Medicines may help:   Reduce your fever.   Reduce your cough.   Relieve nasal congestion.  HOME CARE INSTRUCTIONS    Take medicines only as directed by your health care provider.    Gargle warm saltwater or take cough drops to comfort your throat as directed by your health care provider.   Use a warm mist humidifier or inhale steam from a shower to increase air moisture. This may make it easier to breathe.   Drink enough fluid to keep your urine clear or pale yellow.    Eat soups and other clear broths and maintain good nutrition.    Rest as needed.    Return to work when your temperature has returned to normal or as your health care provider advises. You may need to stay home longer to avoid infecting others. You can also use a face mask and careful hand washing to prevent spread of the virus.   Increase the usage of your inhaler if you have asthma.    Do not   use any tobacco products, including cigarettes, chewing tobacco, or electronic cigarettes. If you need help quitting, ask your health care provider.  PREVENTION   The best way to protect yourself from getting a cold is to practice good hygiene.    Avoid oral or hand contact with people with cold symptoms.    Wash your hands often if contact occurs.   There is no clear evidence that vitamin C, vitamin E, echinacea, or exercise reduces the chance of developing a cold. However, it is always recommended to get plenty of rest, exercise, and practice good nutrition.   SEEK MEDICAL CARE IF:    You are getting worse rather than better.    Your symptoms are not controlled by medicine.    You have chills.   You have worsening shortness of breath.   You have brown or red mucus.   You have yellow or brown nasal  discharge.   You have pain in your face, especially when you bend forward.   You have a fever.   You have swollen neck glands.   You have pain while swallowing.   You have white areas in the back of your throat.  SEEK IMMEDIATE MEDICAL CARE IF:    You have severe or persistent:    Headache.    Ear pain.    Sinus pain.    Chest pain.   You have chronic lung disease and any of the following:    Wheezing.    Prolonged cough.    Coughing up blood.    A change in your usual mucus.   You have a stiff neck.   You have changes in your:    Vision.    Hearing.    Thinking.    Mood.  MAKE SURE YOU:    Understand these instructions.   Will watch your condition.   Will get help right away if you are not doing well or get worse.     This information is not intended to replace advice given to you by your health care provider. Make sure you discuss any questions you have with your health care provider.     Document Released: 01/27/2001 Document Revised: 12/18/2014 Document Reviewed: 11/08/2013  Elsevier Interactive Patient Education 2016 [employer]  Pharyngitis  Pharyngitis is redness, pain, and swelling (inflammation) of your pharynx.   CAUSES   Pharyngitis is usually caused by infection. Most of the time, these infections are from viruses (viral) and are part of a cold. However, sometimes pharyngitis is caused by bacteria (bacterial). Pharyngitis can also be caused by allergies. Viral pharyngitis may be spread from person to person by coughing, sneezing, and personal items or utensils (cups, forks, spoons, toothbrushes). Bacterial pharyngitis may be spread from person to person by more intimate contact, such as kissing.   SIGNS AND SYMPTOMS   Symptoms of pharyngitis include:    Sore throat.    Tiredness (fatigue).    Low-grade fever.    Headache.   Joint pain and muscle aches.   Skin rashes.   Swollen lymph nodes.   Plaque-like film on throat or tonsils (often seen with bacterial  pharyngitis).  DIAGNOSIS   Your health care provider will ask you questions about your illness and your symptoms. Your medical history, along with a physical exam, is often all that is needed to diagnose pharyngitis. Sometimes, a rapid strep test is done. Other lab tests may also be done, depending on the suspected cause.   TREATMENT   Viral   pharyngitis will usually get better in 3-4 days without the use of medicine. Bacterial pharyngitis is treated with medicines that kill germs (antibiotics).   HOME CARE INSTRUCTIONS    Drink enough water and fluids to keep your urine clear or pale yellow.    Only take over-the-counter or prescription medicines as directed by your health care provider:     If you are prescribed antibiotics, make sure you finish them even if you start to feel better.     Do not take aspirin.    Get lots of rest.    Gargle with 8 oz of salt water ( tsp of salt per 1 qt of water) as often as every 1-2 hours to soothe your throat.    Throat lozenges (if you are not at risk for choking) or sprays may be used to soothe your throat.  SEEK MEDICAL CARE IF:    You have large, tender lumps in your neck.   You have a rash.   You cough up green, yellow-brown, or bloody spit.  SEEK IMMEDIATE MEDICAL CARE IF:    Your neck becomes stiff.   You drool or are unable to swallow liquids.   You vomit or are unable to keep medicines or liquids down.   You have severe pain that does not go away with the use of recommended medicines.   You have trouble breathing (not caused by a stuffy nose).  MAKE SURE YOU:    Understand these instructions.   Will watch your condition.   Will get help right away if you are not doing well or get worse.     This information is not intended to replace advice given to you by your health care provider. Make sure you discuss any questions you have with your health care provider.     Document Released: 08/03/2005 Document Revised: 05/24/2013 Document Reviewed:  04/10/2013  Elsevier Interactive Patient Education 2016 [employer] Inc[employer]

## 2015-08-28 NOTE — ED Notes (Signed)
Pt reports sore throat, cough, congestion for 1 week. Pt denies N/V

## 2015-08-30 LAB — CULTURE, GROUP A STREP (THRC)

## 2017-12-23 ENCOUNTER — Other Ambulatory Visit: Payer: Self-pay

## 2017-12-23 ENCOUNTER — Emergency Department (HOSPITAL_COMMUNITY)
Admission: EM | Admit: 2017-12-23 | Discharge: 2017-12-23 | Disposition: A | Discharge status: Home / Self Care | Payer: BLUE CROSS/BLUE SHIELD | Attending: Emergency Medicine | Admitting: Emergency Medicine

## 2017-12-23 DIAGNOSIS — Z5321 Procedure and treatment not carried out due to patient leaving prior to being seen by health care provider: Secondary | ICD-10-CM | POA: Insufficient documentation

## 2017-12-23 DIAGNOSIS — F419 Anxiety disorder, unspecified: Secondary | ICD-10-CM | POA: Insufficient documentation
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
## 2017-12-23 NOTE — ED Notes (Signed)
Patient refused blood draw.

## 2017-12-23 NOTE — ED Notes (Signed)
Pt received a sandwich and drink. Pt states that he felt better after and stated "I'm leaving; I just wanted to make sure I wasn't dying!" Pt refused lab draws and states "I'll just go to my doctors office; I have insurance." Pt present still anxious, agitated, c/o of lightheadedness and dizziness, and flight of idea w/ rambling narrative. Pt witnessed by this RN walking toward ED exit doors.

## 2017-12-27 ENCOUNTER — Other Ambulatory Visit: Payer: Self-pay

## 2017-12-27 ENCOUNTER — Encounter (HOSPITAL_COMMUNITY): Payer: Self-pay | Admitting: Emergency Medicine

## 2017-12-27 ENCOUNTER — Emergency Department (HOSPITAL_COMMUNITY)
Admission: EM | Admit: 2017-12-27 | Discharge: 2017-12-27 | Disposition: A | Discharge status: Home / Self Care | Payer: Self-pay | Attending: Emergency Medicine | Admitting: Emergency Medicine

## 2017-12-27 DIAGNOSIS — F1410 Cocaine abuse, uncomplicated: Secondary | ICD-10-CM | POA: Insufficient documentation

## 2017-12-27 DIAGNOSIS — F17210 Nicotine dependence, cigarettes, uncomplicated: Secondary | ICD-10-CM | POA: Insufficient documentation

## 2017-12-27 DIAGNOSIS — F1510 Other stimulant abuse, uncomplicated: Secondary | ICD-10-CM | POA: Insufficient documentation

## 2017-12-27 DIAGNOSIS — T44905A Adverse effect of unspecified drugs primarily affecting the autonomic nervous system, initial encounter: Secondary | ICD-10-CM | POA: Insufficient documentation

## 2017-12-27 DIAGNOSIS — R451 Restlessness and agitation: Secondary | ICD-10-CM | POA: Insufficient documentation

## 2017-12-27 LAB — CBC
HCT: 42.1 % (ref 39.0–52.0)
Hemoglobin: 14.6 g/dL (ref 13.0–17.0)
MCH: 30.8 pg (ref 26.0–34.0)
MCHC: 34.7 g/dL (ref 30.0–36.0)
MCV: 88.8 fL (ref 78.0–100.0)
PLATELETS: 305 10*3/uL (ref 150–400)
RBC: 4.74 MIL/uL (ref 4.22–5.81)
RDW: 12.2 % (ref 11.5–15.5)
WBC: 8.2 10*3/uL (ref 4.0–10.5)

## 2017-12-27 LAB — RAPID URINE DRUG SCREEN, HOSP PERFORMED
Amphetamines: POSITIVE — AB
Barbiturates: NOT DETECTED
Benzodiazepines: NOT DETECTED
COCAINE: NOT DETECTED
Opiates: NOT DETECTED
Tetrahydrocannabinol: NOT DETECTED

## 2017-12-27 LAB — BASIC METABOLIC PANEL
Anion gap: 9 (ref 5–15)
BUN: 11 mg/dL (ref 6–20)
CALCIUM: 9.3 mg/dL (ref 8.9–10.3)
CO2: 22 mmol/L (ref 22–32)
CREATININE: 0.77 mg/dL (ref 0.61–1.24)
Chloride: 111 mmol/L (ref 101–111)
GFR calc Af Amer: >60 mL/min (ref >60)
Glucose, Bld: 106 mg/dL — ABNORMAL HIGH (ref 65–99)
Potassium: 3.7 mmol/L (ref 3.5–5.1)
SODIUM: 142 mmol/L (ref 135–145)

## 2017-12-27 LAB — I-STAT TROPONIN, ED: TROPONIN I, POC: 0 ng/mL (ref 0.00–0.08)

## 2017-12-27 MED ORDER — HYDROXYZINE HCL 25 MG PO TABS: 25.0000 mg | ORAL_TABLET | Freq: Once | ORAL | Status: DC

## 2017-12-27 MED ORDER — HYDROXYZINE HCL 25 MG PO TABS: 25.0000 mg | ORAL_TABLET | Freq: Four times a day (QID) | ORAL | 0 refills | Status: DC

## 2017-12-27 MED ORDER — SODIUM CHLORIDE 0.9 % IV BOLUS
1000.0000 mL | Freq: Once | INTRAVENOUS | Status: AC
  Administered 2017-12-27: 1000 mL via INTRAVENOUS

## 2017-12-27 NOTE — Discharge Instructions (Signed)
Please see the information and instructions below regarding your visit.  Your diagnoses today include:  1. Adverse effect of sympathomimetics, initial encounter    All of your testing is reassuring today.  I also listed in the paperwork for follow-up options for drug, alcohol, and mental health community resources.  Tests performed today include: See side panel of your discharge paperwork for testing performed today. Vital signs are listed at the bottom of these instructions.   Medications prescribed:    Take any prescribed medications only as prescribed, and any over the counter medications only as directed on the packaging.  You are prescribed Atarax.  This is an antihistamine like Benadryl.  Do not drive, drink, operate machinery when he first take it to know how it will affect you.  It helps with anxiety.  Home care instructions:  Please follow any educational materials contained in this packet.   Follow-up instructions: Please follow-up with your primary care provider as soon as possible for further evaluation of your symptoms if they are not completely improved.  I listed primary care resources for you to follow-up within the community that see patients without insurance.  Return instructions:  Please return to the Emergency Department if you experience worsening symptoms.  Please return to the emergency department if you develop any worsening chest pain, shortness of breath, dizziness, lightheadedness, or new or worsening symptoms. Please return if you have any other emergent concerns.  Additional Information:   Your vital signs today were: BP 124/84 (BP Location: Left Arm)    Pulse 78    Temp 98 F (36.7 C)    Resp 20    Ht  (1.778 m)    Wt 86.2 kg (190 lb)    SpO2 99%    BMI 27.26 kg/m  If your blood pressure (BP) was elevated on multiple readings during this visit above 130 for the top number or above 80 for the bottom number, please have this repeated by your  primary care provider within one month. --------------  Thank you for allowing [place] to participate in your care today.

## 2017-12-27 NOTE — ED Notes (Signed)
Pt coming to registration window adding on complaints stating he must see a Dr. now

## 2017-12-27 NOTE — ED Notes (Signed)
Pt's IV has been d/c at this time and pt is getting dressed.

## 2017-12-27 NOTE — ED Notes (Signed)
Pt refused to go back out to the lobby after being told 3 times. Security at bedside. Pt then agreed.

## 2017-12-27 NOTE — ED Triage Notes (Signed)
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 Pt reports typically using cocaine.

## 2017-12-27 NOTE — ED Provider Notes (Signed)
[facility]-EMERGENCY DEPT Provider Note   CSN: [id number] Arrival date & time: 12/27/17  0025     History   Chief Complaint Chief Complaint  Patient presents with  . Drug Problem    HPI Zachary Lozano is a 32 y.o. male.  HPI   Patient is a 32 year old male with history significant for tobacco use, cocaine use, and recent 1 week of methamphetamine use presenting for agitation, dizziness, nausea, chest pain, shortness of breath.  Patient reports that he used methamphetamine and snorted it for the first time daily last use 7 consecutive days 4 days ago.  Patient reports that since then, he has felt very agitated, has been "twitching", and feels that his vision is tunneling, feels dizzy, lightheaded, off-balance, and has had intermittent chest pain shortness of breath.  Patient reports he has never experienced this before.  Patient reports that he routinely smoked cocaine in the past, last use approximately 3 weeks ago, but has never done that before.  Patient reports that the chest pain or shortness of breath is nonexertional.  No history of cardiac disease.  Patient reports he feels that he is afraid to sleep.   History reviewed. No pertinent past medical history.  There are no active problems to display for this patient.   Past Surgical History:  Procedure Laterality Date  . TOOTH EXTRACTION          Home Medications    Prior to Admission medications   Medication Sig Start Date End Date Taking? Authorizing Provider  benzonatate (TESSALON) 100 MG capsule Take 1 capsule (100 mg total) by mouth 2 (two) times daily as needed for cough. 08/28/15   Everlene Farrier, PA-C  cetirizine (ZYRTEC ALLERGY) 10 MG tablet Take 1 tablet (10 mg total) by mouth daily. 08/28/15   Everlene Farrier, PA-C  fluticasone (FLONASE) 50 MCG/ACT nasal spray Place 2 sprays into both nostrils daily. 08/28/15   Everlene Farrier, PA-C  loperamide (IMODIUM) 2 MG capsule Take 1 capsule (2 mg  total) by mouth 4 (four) times daily as needed for diarrhea or loose stools. 09/17/14   Charm Rings, MD  naproxen (NAPROSYN) 250 MG tablet Take 1 tablet (250 mg total) by mouth 2 (two) times daily with a meal. 08/28/15   Everlene Farrier, PA-C  ondansetron (ZOFRAN) 4 MG tablet Take 1 tablet (4 mg total) by mouth every 8 (eight) hours as needed for nausea or vomiting. 09/17/14   Charm Rings, MD    Family History Family History  Problem Relation Age of Onset  . Diabetes Mother   . Cancer Mother   . Emphysema Father   . Cirrhosis Father     Social History Social History   Tobacco Use  . Smoking status: Current Every Day Smoker    Packs/day: 1.00    Types: Cigarettes  . Smokeless tobacco: Never Used  Substance Use Topics  . Alcohol use: No    Comment: ocassionally  . Drug use: Yes    Types: Cocaine, Methamphetamines     Allergies   Patient has no known allergies.   Review of Systems Review of Systems  Constitutional: Negative for chills and fever.  HENT: Positive for congestion and rhinorrhea.   Respiratory: Positive for shortness of breath. Negative for chest tightness and wheezing.   Cardiovascular: Positive for chest pain and palpitations.  Gastrointestinal: Positive for nausea. Negative for abdominal pain and vomiting.  Genitourinary: Negative for dysuria.  Skin: Negative for rash.  Neurological: Positive for  dizziness and light-headedness. Negative for seizures, weakness, numbness and headaches.  Psychiatric/Behavioral: Positive for agitation and dysphoric mood.  All other systems reviewed and are negative.    Physical Exam Updated Vital Signs BP 124/84 (BP Location: Left Arm)   Pulse 78   Temp 98 F (36.7 C)   Resp 20   Ht  (1.778 m)   Wt 86.2 kg (190 lb)   SpO2 99%   BMI 27.26 kg/m   Physical Exam  Constitutional: He appears well-developed and well-nourished. No distress.  HENT:  Head: Normocephalic and atraumatic.  Mouth/Throat: Oropharynx is  clear and moist.  Eyes: Pupils are equal, round, and reactive to light. Conjunctivae and EOM are normal.  Pupils 3 mm and equal bilaterally.  Neck: Normal range of motion. Neck supple.  Cardiovascular: Normal rate, regular rhythm, S1 normal and S2 normal.  No murmur heard. Pulmonary/Chest: Effort normal and breath sounds normal. He has no wheezes. He has no rales.  Abdominal: Soft. He exhibits no distension. There is no tenderness. There is no guarding.  Musculoskeletal: Normal range of motion. He exhibits no edema or deformity.  Neurological: He is alert.  Cranial nerves grossly intact. Patient moves extremities symmetrically and with good coordination. Romberg negative.  Patient has normal and symmetric gait.  Skin: Skin is warm and dry. No rash noted. No erythema.  Psychiatric: He has a normal mood and affect. His behavior is normal. Judgment and thought content normal.  Nursing note and vitals reviewed.    ED Treatments / Results  Labs (all labs ordered are listed, but only abnormal results are displayed) Labs Reviewed  RAPID URINE DRUG SCREEN, HOSP PERFORMED - Abnormal; Notable for the following components:      Result Value   Amphetamines POSITIVE (*)    All other components within normal limits  CBC  BASIC METABOLIC PANEL  I-STAT TROPONIN, ED    EKG EKG Interpretation  Date/Time:  Monday Dec 27 2017 06:45:32 EDT Ventricular Rate:  80 PR Interval:    QRS Duration: 92 QT Interval:  396 QTC Calculation: 457 R Axis:   89 Text Interpretation:  Sinus rhythm Right atrial enlargement No significant change since last tracing 23 Dec 2017 Confirmed by Devoria Albe ([id number]) on 12/27/2017 7:02:07 AM   Radiology No results found.  Procedures Procedures (including critical care time)  Medications Ordered in ED Medications  sodium chloride 0.9 % bolus 1,000 mL (1,000 mLs Intravenous New Bag/Given 12/27/17 0655)  hydrOXYzine (ATARAX/VISTARIL) tablet 25 mg (25 mg Oral Refused  12/27/17 0708)     Initial Impression / Assessment and Plan / ED Course  I have reviewed the triage vital signs and the nursing notes.  Pertinent labs & imaging results that were available during my care of the patient were reviewed by me and considered in my medical decision making (see chart for details).  Clinical Course as of Dec 27 917  Mon Dec 27, 2017  1914 Reevaluated patient.  He is resting comfortably, and no longer feeling as agitated or having the visual symptoms or dizziness or lightheadedness.  Patient feels stable for discharge.  Patient did refuse Atarax.   [AM]    Clinical Course User Index [AM] Elisha Ponder, PA-C    Patient is nontoxic-appearing, afebrile, non-tachycardic, but significantly agitated.  Given recent history of sympathomimetic use, and chest pain or shortness of breath, will check EKG and troponin.  Additionally will check for any other laterally derangements possibly accounting for patient's lightheadedness and dizziness.  Feel that patient is still within the window in which he could be feeling  Systemic effects of methamphetamine use following cessation.  Provided no cardiac emergencies, feel the patient is neurologically intact, and can follow-up for mental health and substance use with outpatient resources, and receive Atarax for agitation and anxiety.  Suspect that patient's symptoms are related to adverse effects of new meth use.  No evidence of cardiopulmonary disease on examination and testing.  No electro light abnormalities.  Patient neurologically intact on my examination.  Patient provided resources for outpatient follow-up for drug, alcohol, and mental health resources.  Return precautions given for any new or worsening symptoms.  Final Clinical Impressions(s) / ED Diagnoses   Final diagnoses:  Adverse effect of sympathomimetics, initial encounter    ED Discharge Orders        Ordered    hydrOXYzine (ATARAX/VISTARIL) 25 MG tablet   Every 6 hours     12/27/17 0915       Elisha Ponder, PA-C 12/27/17 [id number]    Devoria Albe, MD 12/27/17 2302

## 2018-01-13 ENCOUNTER — Other Ambulatory Visit: Payer: Self-pay

## 2018-01-13 ENCOUNTER — Emergency Department (HOSPITAL_COMMUNITY)
Admission: EM | Admit: 2018-01-13 | Discharge: 2018-01-13 | Disposition: A | Discharge status: Home / Self Care | Payer: Self-pay | Attending: Emergency Medicine | Admitting: Emergency Medicine

## 2018-01-13 DIAGNOSIS — F17210 Nicotine dependence, cigarettes, uncomplicated: Secondary | ICD-10-CM | POA: Insufficient documentation

## 2018-01-13 DIAGNOSIS — K029 Dental caries, unspecified: Secondary | ICD-10-CM | POA: Insufficient documentation

## 2018-01-13 MED ORDER — IBUPROFEN 800 MG PO TABS
800.0000 mg | ORAL_TABLET | Freq: Three times a day (TID) | ORAL | 0 refills | Status: DC
Start: 2018-01-13 — End: 2020-07-26

## 2018-01-13 MED ORDER — PENICILLIN V POTASSIUM 500 MG PO TABS: 500.0000 mg | ORAL_TABLET | Freq: Three times a day (TID) | ORAL | 0 refills | Status: DC

## 2018-01-13 MED ORDER — BUPIVACAINE-EPINEPHRINE (PF) 0.5% -1:200000 IJ SOLN
1.8000 mL | Freq: Once | INTRAMUSCULAR | Status: AC
  Administered 2018-01-13: 1.8 mL
  Filled 2018-01-13: qty 1.8

## 2018-01-13 NOTE — Discharge Instructions (Signed)
Please call and follow up closely with a dentist for further management of your dental pain.  °

## 2018-01-13 NOTE — ED Triage Notes (Signed)
Patient states that jaw awoke patient where he has a "rotten damn wisdom tooth". States that he had taken 4 vicodin with no relief and would like a "shot". States he plans to have the tooth pulled today,

## 2018-01-13 NOTE — ED Provider Notes (Signed)
[facility] EMERGENCY DEPARTMENT Provider Note   CSN: [id number] Arrival date & time: 01/13/18  2130     History   Chief Complaint Chief Complaint  Patient presents with  . Dental Pain    HPI Zachary Lozano is a 32 y.o. male.  HPI   32 year old male presenting for evaluation of dental pain.  Patient report he has had recurrent pain to his left lower molar for the past year.  Pain has been intermittent however it intensified last night.  Described pain as a achy throbbing sensation, 10 out of 10, nonradiating, and not improved despite using Tylenol, hydrocodone, and Orajel.  He does not have a dentist.  He is a smoker.  He denies any recent injury.  No fever or neck pain or ear pain.  No trouble swallowing.    No past medical history on file.  There are no active problems to display for this patient.   Past Surgical History:  Procedure Laterality Date  . TOOTH EXTRACTION          Home Medications    Prior to Admission medications   Medication Sig Start Date End Date Taking? Authorizing Provider  benzonatate (TESSALON) 100 MG capsule Take 1 capsule (100 mg total) by mouth 2 (two) times daily as needed for cough. Patient not taking: Reported on 12/27/2017 08/28/15   Everlene Farrier, PA-C  cetirizine (ZYRTEC ALLERGY) 10 MG tablet Take 1 tablet (10 mg total) by mouth daily. Patient not taking: Reported on 12/27/2017 08/28/15   Everlene Farrier, PA-C  fluticasone [facility]) 50 MCG/ACT nasal spray Place 2 sprays into both nostrils daily. Patient not taking: Reported on 12/27/2017 08/28/15   Everlene Farrier, PA-C  hydrOXYzine (ATARAX/VISTARIL) 25 MG tablet Take 1 tablet (25 mg total) by mouth every 6 (six) hours. 12/27/17   Aviva Kluver B, PA-C  loperamide (IMODIUM) 2 MG capsule Take 1 capsule (2 mg total) by mouth 4 (four) times daily as needed for diarrhea or loose stools. Patient not taking: Reported on 12/27/2017 09/17/14   Charm Rings, MD  naproxen  (NAPROSYN) 250 MG tablet Take 1 tablet (250 mg total) by mouth 2 (two) times daily with a meal. Patient not taking: Reported on 12/27/2017 08/28/15   Everlene Farrier, PA-C  ondansetron (ZOFRAN) 4 MG tablet Take 1 tablet (4 mg total) by mouth every 8 (eight) hours as needed for nausea or vomiting. Patient not taking: Reported on 12/27/2017 09/17/14   Charm Rings, MD    Family History Family History  Problem Relation Age of Onset  . Diabetes Mother   . Cancer Mother   . Emphysema Father   . Cirrhosis Father     Social History Social History   Tobacco Use  . Smoking status: Current Every Day Smoker    Packs/day: 1.00    Types: Cigarettes  . Smokeless tobacco: Never Used  Substance Use Topics  . Alcohol use: No    Comment: ocassionally  . Drug use: Yes    Types: Cocaine, Methamphetamines     Allergies   Patient has no known allergies.   Review of Systems Review of Systems  Constitutional: Negative for fever.  HENT: Positive for dental problem.      Physical Exam Updated Vital Signs BP 133/88   Pulse 92   Temp 97.8 F (36.6 C) (Oral)   Resp 19   Ht  (1.778 m)   Wt 90.7 kg (200 lb)   SpO2 100%  BMI 28.70 kg/m   Physical Exam  Constitutional: He appears well-developed and well-nourished. No distress.  HENT:  Head: Atraumatic.  Mouth: Moderate dental decay noted to tooth #17, tender to palpation, no surrounding gingival erythema or abscess noted, no trismus.  Throat exam unremarkable.  Eyes: Conjunctivae are normal.  Neck: Neck supple.  Neurological: He is alert.  Skin: No rash noted.  Psychiatric: He has a normal mood and affect.  Nursing note and vitals reviewed.    ED Treatments / Results  Labs (all labs ordered are listed, but only abnormal results are displayed) Labs Reviewed - No data to display  EKG None  Radiology No results found.  Procedures Procedures (including critical care time)  .Nerve Block Date/Time: 01/13/2018 7:38  AM Performed by: Fayrene Helper, PA-C Authorized by: Fayrene Helper, PA-C   Consent:    Consent obtained:  Verbal   Consent given by:  Patient   Risks discussed:  Pain and unsuccessful block   Alternatives discussed:  No treatment Indications:    Indications:  Pain relief Location:    Body area:  Head   Head nerve blocked: mouth: L inferior alveolar nerve block.   Laterality:  Left Skin anesthesia (see MAR for exact dosages):    Skin anesthesia method:  None Procedure details (see MAR for exact dosages):    Block needle gauge:  27 G   Anesthetic injected:  Bupivacaine 0.5% WITH epi   Steroid injected:  None   Additive injected:  None   Injection procedure:  Anatomic landmarks palpated   Paresthesia:  Immediately resolved Post-procedure details:    Dressing:  None   Outcome:  Anesthesia achieved   Patient tolerance of procedure:  Tolerated well, no immediate complications  Medications Ordered in ED Medications - No data to display   Initial Impression / Assessment and Plan / ED Course  I have reviewed the triage vital signs and the nursing notes.  Pertinent labs & imaging results that were available during my care of the patient were reviewed by me and considered in my medical decision making (see chart for details).     BP 133/88   Pulse 92   Temp 97.8 F (36.6 C) (Oral)   Resp 19   Ht  (1.778 m)   Wt 90.7 kg (200 lb)   SpO2 100%   BMI 28.70 kg/m    Final Clinical Impressions(s) / ED Diagnoses   Final diagnoses:  Pain due to dental caries    ED Discharge Orders        Ordered    ibuprofen (ADVIL,MOTRIN) 800 MG tablet  3 times daily     01/13/18 0745    penicillin v potassium (VEETID) 500 MG tablet  3 times daily     01/13/18 0745     6:56 AM Patient here with dental pain related to dental decay.  Pain is involving tooth 17 that is moderately decay without evidence of abscess or deep tissue infection.  Will perform dental block, patient will be  discharged home with antibiotic, and anti-inflammatory medication.     Fayrene Helper, PA-C 01/13/18 1914    Glynn Octave, MD 01/13/18 [phone]

## 2018-03-09 ENCOUNTER — Ambulatory Visit (HOSPITAL_COMMUNITY)
Admission: EM | Admit: 2018-03-09 | Discharge: 2018-03-09 | Disposition: A | Discharge status: Home / Self Care | Payer: Self-pay | Attending: Family Medicine | Admitting: Family Medicine

## 2018-03-09 ENCOUNTER — Other Ambulatory Visit: Payer: Self-pay

## 2018-03-09 ENCOUNTER — Encounter (HOSPITAL_COMMUNITY): Payer: Self-pay | Admitting: Emergency Medicine

## 2018-03-09 DIAGNOSIS — J069 Acute upper respiratory infection, unspecified: Secondary | ICD-10-CM

## 2018-03-09 DIAGNOSIS — B9789 Other viral agents as the cause of diseases classified elsewhere: Secondary | ICD-10-CM

## 2018-03-09 MED ORDER — GUAIFENESIN 100 MG/5ML PO LIQD: 100.0000 mg | ORAL | 0 refills | Status: DC | PRN

## 2018-03-09 MED ORDER — BENZONATATE 100 MG PO CAPS: 100.0000 mg | ORAL_CAPSULE | Freq: Three times a day (TID) | ORAL | 0 refills | Status: DC

## 2018-03-09 NOTE — ED Provider Notes (Signed)
[facility]    CSN: [id number] Arrival date & time: 03/09/18  [id number]     History   Chief Complaint [id number] Complaint  Patient presents with  . URI    HPI Grandville SilosDavid C Revoir is a 32 y.o. male.   Patient is a 32 year old male with 3 days of cough, congestion, body aches, chills, low-grade fever.  The congestion is in his chest.  He has been coughing up some yellow/green sputum.  He denies any nasal congestion, runny nose, ear pain, sore throat. He denies any chest pain, SOB.  Symptoms have remained the same.  He has been using Tylenol for body aches.  He needs a note for work.   He is a smoker.  He has no medical history.   ROS per HPI      History reviewed. No pertinent past medical history.  There are no active problems to display for this patient.   Past Surgical History:  Procedure Laterality Date  . TOOTH EXTRACTION         Home Medications    Prior to Admission medications   Medication Sig Start Date End Date Taking? Authorizing Provider  acetaminophen (TYLENOL) 325 MG tablet Take 650 mg by mouth every 6 (six) hours as needed.   Yes [provider]  benzonatate (TESSALON) 100 MG capsule Take 1 capsule (100 mg total) by mouth every 8 (eight) hours. 03/09/18   Dahlia ByesBast, Purcell Jungbluth A, NP  guaiFENesin (ROBITUSSIN) 100 MG/5ML liquid Take 5-10 mLs (100-200 mg total) by mouth every 4 (four) hours as needed for cough. 03/09/18   Dahlia ByesBast, Pricilla Moehle A, NP  hydrOXYzine (ATARAX/VISTARIL) 25 MG tablet Take 1 tablet (25 mg total) by mouth every 6 (six) hours. 12/27/17   Aviva KluverMurray, Alyssa B, PA-C  ibuprofen (ADVIL,MOTRIN) 800 MG tablet Take 1 tablet (800 mg total) by mouth 3 (three) times daily. 01/13/18   Fayrene Helperran, Bowie, PA-C  penicillin v potassium (VEETID) 500 MG tablet Take 1 tablet (500 mg total) by mouth 3 (three) times daily. 01/13/18   Fayrene Helperran, Bowie, PA-C    Family History Family History  Problem Relation Age of Onset  . Diabetes Mother   . Cancer Mother   . Emphysema Father     . Cirrhosis Father     Social History Social History   Tobacco Use  . Smoking status: Current Every Day Smoker    Packs/day: 1.00    Types: Cigarettes  . Smokeless tobacco: Never Used  Substance Use Topics  . Alcohol use: No    Comment: ocassionally  . Drug use: Yes    Types: Cocaine, Methamphetamines     Allergies   Patient has no known allergies.   Review of Systems Review of Systems   Physical Exam Triage Vital Signs ED Triage Vitals  Enc Vitals Group     BP 03/09/18 0934 123/81     Pulse Rate 03/09/18 0934 80     Resp 03/09/18 0934 16     Temp 03/09/18 0934 98.6 F (37 C)     Temp Source 03/09/18 0934 Oral     SpO2 03/09/18 0934 97 %     Weight --      Height --      Head Circumference --      Peak Flow --      Pain Score 03/09/18 0940 0     Pain Loc --      Pain Edu? --      Excl. in GC? --  No data found.  Updated Vital Signs BP 123/81 (BP Location: Right Arm)   Pulse 80   Temp 98.6 F (37 C) (Oral)   Resp 16   SpO2 97%   Visual Acuity Right Eye Distance:   Left Eye Distance:   Bilateral Distance:    Right Eye Near:   Left Eye Near:    Bilateral Near:     Physical Exam  Constitutional: He is oriented to person, place, and time. He appears well-developed.  HENT:  Head: Normocephalic and atraumatic.  Right Ear: External ear normal.  Left Ear: External ear normal.  Nose: Nose normal.  Mouth/Throat: Oropharynx is clear and moist. No oropharyngeal exudate.  Eyes: Conjunctivae are normal.  Neck: Normal range of motion. Neck supple.  Cardiovascular: Normal rate and regular rhythm.  Pulmonary/Chest: Effort normal and breath sounds normal.  Musculoskeletal: Normal range of motion.  Lymphadenopathy:    He has no cervical adenopathy.  Neurological: He is alert and oriented to person, place, and time.  Skin: Skin is warm and dry. Capillary refill takes less than 2 seconds.  Psychiatric: He has a normal mood and affect.  Nursing note  and vitals reviewed.    UC Treatments / Results  Labs (all labs ordered are listed, but only abnormal results are displayed) Labs Reviewed - No data to display  EKG None  Radiology No results found.  Procedures Procedures (including critical care time)  Medications Ordered in UC Medications - No data to display  Initial Impression / Assessment and Plan / UC Course  I have reviewed the triage vital signs and the nursing notes.  Pertinent labs & imaging results that were available during my care of the patient were reviewed by me and considered in my medical decision making (see chart for details).     Lungs clear on exam.  Non toxic appearance.  Most likely viral upper respiratory infection.  Will treat with cough, congestion medication.  Aleve for fever and body aches.  Follow-up as needed or return for worsening symptoms. Final Clinical Impressions(s) / UC Diagnoses   Final diagnoses:  Viral URI with cough     Discharge Instructions     It was nice meeting you!!  I believe you have an upper respiratory infection.  I will give you a few options for cough. You can take both together. You can take aleve for body aches.  It can take 7-10 days before you start feeling better.  Stay hydrated. And try to cut back on smoking.  Follow up here as needed if not better.     ED Prescriptions    Medication Sig Dispense Auth. Provider   benzonatate (TESSALON) 100 MG capsule Take 1 capsule (100 mg total) by mouth every 8 (eight) hours. 21 capsule Mlissa Tamayo A, NP   guaiFENesin (ROBITUSSIN) 100 MG/5ML liquid Take 5-10 mLs (100-200 mg total) by mouth every 4 (four) hours as needed for cough. 60 mL Dahlia Byes A, NP     Controlled Substance Prescriptions [place] Controlled Substance Registry consulted? no   Janace Aris, NP 03/09/18 1015

## 2018-03-09 NOTE — Discharge Instructions (Addendum)
It was nice meeting you!!  I believe you have an upper respiratory infection.  I will give you a few options for cough. You can take both together. You can take aleve for body aches.  It can take 7-10 days before you start feeling better.  Stay hydrated. And try to cut back on smoking.  Follow up here as needed if not better.

## 2018-03-09 NOTE — ED Triage Notes (Signed)
Chest congestion and productive cough for 3 days, brown/yellow phlegm per patient.

## 2018-03-24 ENCOUNTER — Emergency Department (HOSPITAL_COMMUNITY)
Admission: EM | Admit: 2018-03-24 | Discharge: 2018-03-25 | Disposition: A | Discharge status: Home / Self Care | Payer: Self-pay | Attending: Emergency Medicine | Admitting: Emergency Medicine

## 2018-03-24 ENCOUNTER — Encounter (HOSPITAL_COMMUNITY): Payer: Self-pay

## 2018-03-24 ENCOUNTER — Other Ambulatory Visit: Payer: Self-pay

## 2018-03-24 DIAGNOSIS — Z79899 Other long term (current) drug therapy: Secondary | ICD-10-CM | POA: Insufficient documentation

## 2018-03-24 DIAGNOSIS — F15929 Other stimulant use, unspecified with intoxication, unspecified: Secondary | ICD-10-CM | POA: Insufficient documentation

## 2018-03-24 DIAGNOSIS — F17210 Nicotine dependence, cigarettes, uncomplicated: Secondary | ICD-10-CM | POA: Insufficient documentation

## 2018-03-24 LAB — CBC WITH DIFFERENTIAL/PLATELET
ABS IMMATURE GRANULOCYTES: 0.1 10*3/uL (ref 0.0–0.1)
Basophils Absolute: 0.1 10*3/uL (ref 0.0–0.1)
Basophils Relative: 1 %
EOS PCT: 3 %
Eosinophils Absolute: 0.4 10*3/uL (ref 0.0–0.7)
HEMATOCRIT: 45.2 % (ref 39.0–52.0)
HEMOGLOBIN: 15.4 g/dL (ref 13.0–17.0)
IMMATURE GRANULOCYTES: 0 %
LYMPHS ABS: 2.3 10*3/uL (ref 0.7–4.0)
LYMPHS PCT: 20 %
MCH: 30.4 pg (ref 26.0–34.0)
MCHC: 34.1 g/dL (ref 30.0–36.0)
MCV: 89.2 fL (ref 78.0–100.0)
Monocytes Absolute: 1.1 10*3/uL — ABNORMAL HIGH (ref 0.1–1.0)
Monocytes Relative: 10 %
Neutro Abs: 7.4 10*3/uL (ref 1.7–7.7)
Neutrophils Relative %: 66 %
Platelets: 320 10*3/uL (ref 150–400)
RBC: 5.07 MIL/uL (ref 4.22–5.81)
RDW: 11.9 % (ref 11.5–15.5)
WBC: 11.3 10*3/uL — AB (ref 4.0–10.5)

## 2018-03-24 LAB — URINALYSIS, ROUTINE W REFLEX MICROSCOPIC
Bilirubin Urine: NEGATIVE
Glucose, UA: NEGATIVE mg/dL
Hgb urine dipstick: NEGATIVE
Ketones, ur: 5 mg/dL — AB
Leukocytes, UA: NEGATIVE
NITRITE: NEGATIVE
PH: 6 (ref 5.0–8.0)
Protein, ur: NEGATIVE mg/dL
SPECIFIC GRAVITY, URINE: 1.033 — AB (ref 1.005–1.030)

## 2018-03-24 LAB — COMPREHENSIVE METABOLIC PANEL
ALT: 39 U/L (ref 0–44)
AST: 30 U/L (ref 15–41)
Albumin: 4.2 g/dL (ref 3.5–5.0)
Alkaline Phosphatase: 68 U/L (ref 38–126)
Anion gap: 11 (ref 5–15)
BUN: 19 mg/dL (ref 6–20)
CHLORIDE: 104 mmol/L (ref 98–111)
CO2: 23 mmol/L (ref 22–32)
Calcium: 9.5 mg/dL (ref 8.9–10.3)
Creatinine, Ser: 1.07 mg/dL (ref 0.61–1.24)
GFR calc Af Amer: >60 mL/min (ref >60)
Glucose, Bld: 101 mg/dL — ABNORMAL HIGH (ref 70–99)
POTASSIUM: 3.7 mmol/L (ref 3.5–5.1)
Sodium: 138 mmol/L (ref 135–145)
Total Bilirubin: 1 mg/dL (ref 0.3–1.2)
Total Protein: 7 g/dL (ref 6.5–8.1)

## 2018-03-24 LAB — RAPID URINE DRUG SCREEN, HOSP PERFORMED
Amphetamines: POSITIVE — AB
BARBITURATES: NOT DETECTED
BENZODIAZEPINES: NOT DETECTED
COCAINE: NOT DETECTED
Opiates: NOT DETECTED
TETRAHYDROCANNABINOL: NOT DETECTED

## 2018-03-24 LAB — ETHANOL: Alcohol, Ethyl (B): <10 mg/dL (ref <10)

## 2018-03-24 NOTE — ED Notes (Signed)
Called for triage x2 no response. 
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
## 2018-03-24 NOTE — ED Notes (Signed)
Called pt for vitals x2, no response. 

## 2018-03-24 NOTE — ED Notes (Signed)
Pt called for vitals x2, no response. 

## 2018-03-24 NOTE — ED Notes (Signed)
Called for triage x1, no response ?
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
## 2018-03-25 ENCOUNTER — Encounter (HOSPITAL_COMMUNITY): Payer: Self-pay | Admitting: Emergency Medicine

## 2018-03-25 ENCOUNTER — Other Ambulatory Visit: Payer: Self-pay

## 2018-03-25 ENCOUNTER — Emergency Department (HOSPITAL_COMMUNITY)
Admission: EM | Admit: 2018-03-25 | Discharge: 2018-03-25 | Disposition: A | Discharge status: Home / Self Care | Payer: Self-pay | Attending: Emergency Medicine | Admitting: Emergency Medicine

## 2018-03-25 DIAGNOSIS — R21 Rash and other nonspecific skin eruption: Secondary | ICD-10-CM | POA: Insufficient documentation

## 2018-03-25 DIAGNOSIS — Z79899 Other long term (current) drug therapy: Secondary | ICD-10-CM | POA: Insufficient documentation

## 2018-03-25 DIAGNOSIS — F17210 Nicotine dependence, cigarettes, uncomplicated: Secondary | ICD-10-CM | POA: Insufficient documentation

## 2018-03-25 MED ORDER — GI COCKTAIL ~~LOC~~
30.0000 mL | Freq: Once | ORAL | Status: AC
  Administered 2018-03-25: 30 mL via ORAL
  Filled 2018-03-25: qty 30
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
## 2018-03-25 NOTE — ED Notes (Signed)
Signature pad unavailable at time of pt discharge. Pt verbalized understanding of d/c instructions. Pt currently speaking with officer about filing report. Pt notified he is able to go when he finishes with them.
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
## 2018-03-25 NOTE — Discharge Instructions (Signed)
Use benadryl by mouth as needed  Apply 1% hydrocortisone cream to the rash twice a day as needed
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
## 2020-06-04 ENCOUNTER — Emergency Department (HOSPITAL_COMMUNITY)
Admission: EM | Admit: 2020-06-04 | Discharge: 2020-06-04 | Disposition: A | Discharge status: Home / Self Care | Payer: Self-pay | Attending: Emergency Medicine | Admitting: Emergency Medicine

## 2020-06-04 ENCOUNTER — Other Ambulatory Visit: Payer: Self-pay

## 2020-06-04 ENCOUNTER — Encounter (HOSPITAL_COMMUNITY): Payer: Self-pay

## 2020-06-04 DIAGNOSIS — X58XXXA Exposure to other specified factors, initial encounter: Secondary | ICD-10-CM | POA: Insufficient documentation

## 2020-06-04 DIAGNOSIS — F17210 Nicotine dependence, cigarettes, uncomplicated: Secondary | ICD-10-CM | POA: Insufficient documentation

## 2020-06-04 DIAGNOSIS — S39012A Strain of muscle, fascia and tendon of lower back, initial encounter: Secondary | ICD-10-CM | POA: Insufficient documentation

## 2020-06-04 MED ORDER — HYDROCODONE-ACETAMINOPHEN 5-325 MG PO TABS
1.0000 | ORAL_TABLET | Freq: Once | ORAL | Status: AC
  Administered 2020-06-04: 1 via ORAL
  Filled 2020-06-04: qty 1

## 2020-06-04 MED ORDER — PREDNISONE 10 MG PO TABS: 40.0000 mg | ORAL_TABLET | Freq: Every day | ORAL | 0 refills | Status: AC

## 2020-06-04 MED ORDER — HYDROCODONE-ACETAMINOPHEN 5-325 MG PO TABS: 1.0000 | ORAL_TABLET | Freq: Four times a day (QID) | ORAL | 0 refills | Status: AC | PRN

## 2020-06-04 MED ORDER — PREDNISONE 20 MG PO TABS
60.0000 mg | ORAL_TABLET | Freq: Once | ORAL | Status: AC
  Administered 2020-06-04: 60 mg via ORAL
  Filled 2020-06-04: qty 3

## 2020-06-04 NOTE — ED Triage Notes (Signed)
Patient c/o bilateral lower back pain x 5 days. Patient denies any injuries.

## 2020-06-04 NOTE — ED Provider Notes (Signed)
[facility]-EMERGENCY DEPT Provider Note   CSN: [id number] Arrival date & time: 06/04/20  0756     History Chief Complaint  Patient presents with  . Back Pain    Zachary Lozano is a 34 y.o. male.  Patient with complaint of bilateral low back pain.  That radiates into both buttocks.  This is been ongoing since Thursday.  For a total of 5 days.  No history of injury no.  History of prior problems.  No difficulty with urination.  Does talk about some tingling to his toes but motor strength in both feet and legs is good.  Also describes some tingling in his upper extremity but has no neck pain.  The low back pain is made worse by bending over and by movement.  No nausea or vomiting no fevers.  No dysuria.  No blood in the urine.        History reviewed. No pertinent past medical history.  There are no problems to display for this patient.   Past Surgical History:  Procedure Laterality Date  . TOOTH EXTRACTION         Family History  Problem Relation Age of Onset  . Diabetes Mother   . Cancer Mother   . Emphysema Father   . Cirrhosis Father     Social History   Tobacco Use  . Smoking status: Current Every Day Smoker    Packs/day: 1.00    Types: Cigarettes  . Smokeless tobacco: Never Used  Vaping Use  . Vaping Use: Former  Substance Use Topics  . Alcohol use: Yes    Comment: ocassionally  . Drug use: Yes    Types: Cocaine    Home Medications Prior to Admission medications   Medication Sig Start Date End Date Taking? Authorizing Provider  acetaminophen (TYLENOL) 325 MG tablet Take 650 mg by mouth every 6 (six) hours as needed.    [provider]  benzonatate (TESSALON) 100 MG capsule Take 1 capsule (100 mg total) by mouth every 8 (eight) hours. 03/09/18   Dahlia Byes A, NP  guaiFENesin (ROBITUSSIN) 100 MG/5ML liquid Take 5-10 mLs (100-200 mg total) by mouth every 4 (four) hours as needed for cough. 03/09/18   Dahlia Byes A, NP   HYDROcodone-acetaminophen (NORCO/VICODIN) 5-325 MG tablet Take 1 tablet by mouth every 6 (six) hours as needed for moderate pain. 06/04/20   Vanetta Mulders, MD  hydrOXYzine (ATARAX/VISTARIL) 25 MG tablet Take 1 tablet (25 mg total) by mouth every 6 (six) hours. 12/27/17   Aviva Kluver B, PA-C  ibuprofen (ADVIL,MOTRIN) 800 MG tablet Take 1 tablet (800 mg total) by mouth 3 (three) times daily. 01/13/18   Fayrene Helper, PA-C  penicillin v potassium (VEETID) 500 MG tablet Take 1 tablet (500 mg total) by mouth 3 (three) times daily. 01/13/18   Fayrene Helper, PA-C  predniSONE (DELTASONE) 10 MG tablet Take 4 tablets (40 mg total) by mouth daily. 06/04/20   Vanetta Mulders, MD    Allergies    Patient has no known allergies.  Review of Systems   Review of Systems  Constitutional: Negative for chills and fever.  HENT: Negative for congestion, rhinorrhea and sore throat.   Eyes: Negative for visual disturbance.  Respiratory: Negative for cough and shortness of breath.   Cardiovascular: Negative for chest pain and leg swelling.  Gastrointestinal: Negative for abdominal pain, diarrhea, nausea and vomiting.  Genitourinary: Negative for difficulty urinating, dysuria and hematuria.  Musculoskeletal: Positive for back pain. Negative for  neck pain.  Skin: Negative for rash.  Neurological: Positive for numbness. Negative for dizziness, light-headedness and headaches.  Hematological: Does not bruise/bleed easily.  Psychiatric/Behavioral: Negative for confusion.    Physical Exam Updated Vital Signs BP 121/84 (BP Location: Left Arm)   Pulse 92   Temp 98 F (36.7 C) (Oral)   Resp 16   Ht 1.778 m (5\' 10" )   Wt 81.6 kg   SpO2 100%   BMI 25.83 kg/m   Physical Exam Vitals and nursing note reviewed.  Constitutional:      Appearance: He is well-developed.  HENT:     Head: Normocephalic and atraumatic.  Eyes:     Extraocular Movements: Extraocular movements intact.     Conjunctiva/sclera:  Conjunctivae normal.     Pupils: Pupils are equal, round, and reactive to light.  Cardiovascular:     Rate and Rhythm: Normal rate and regular rhythm.     Heart sounds: No murmur heard.   Pulmonary:     Effort: Pulmonary effort is normal. No respiratory distress.     Breath sounds: Normal breath sounds.  Abdominal:     Palpations: Abdomen is soft.     Tenderness: There is no abdominal tenderness.  Musculoskeletal:        General: Tenderness present.     Cervical back: Neck supple.     Comments: No tenderness to palpation on the thoracic or lumbar spine.  But some mild tenderness bilaterally over the lumbar muscle area.  No erythema.  No obvious swelling.  Skin:    General: Skin is warm and dry.  Neurological:     General: No focal deficit present.     Mental Status: He is alert and oriented to person, place, and time.     Cranial Nerves: No cranial nerve deficit.     Sensory: No sensory deficit.     Motor: No weakness.     ED Results / Procedures / Treatments   Labs (all labs ordered are listed, but only abnormal results are displayed) Labs Reviewed - No data to display  EKG None  Radiology No results found.  Procedures Procedures (including critical care time)  Medications Ordered in ED Medications  HYDROcodone-acetaminophen (NORCO/VICODIN) 5-325 MG per tablet 1 tablet (1 tablet Oral Given 06/04/20 0904)  predniSONE (DELTASONE) tablet 60 mg (60 mg Oral Given 06/04/20 06/06/20)    ED Course  I have reviewed the triage vital signs and the nursing notes.  Pertinent labs & imaging results that were available during my care of the patient were reviewed by me and considered in my medical decision making (see chart for details).    MDM Rules/Calculators/A&P                          Symptoms started 5 days ago.  No history of injury.  Seem to be consistent with a lumbar strain.  No evidence of any intra-abdominal process.  Does have some tingling in both feet but no motor  weakness.  Also has some tingling in the hands which would be unrelated to the back pain.  No neck pain.  We will go ahead and treat symptomatically with prednisone hydrocodone and follow-up with Ortho if things are not improved.  To have him return here.  If things do not improve in 2 weeks MRI may be needed.    Patient without any urinary tract symptoms.    Final Clinical Impression(s) / ED Diagnoses Final diagnoses:  Strain of lumbar region, initial encounter    Rx / DC Orders ED Discharge Orders         Ordered    predniSONE (DELTASONE) 10 MG tablet  Daily        06/04/20 0908    HYDROcodone-acetaminophen (NORCO/VICODIN) 5-325 MG tablet  Every 6 hours PRN        06/04/20 0908           Vanetta Mulders, MD 06/04/20 [phone]

## 2020-06-04 NOTE — Discharge Instructions (Signed)
Take the prednisone as directed for the next 5 days.  Take the hydrocodone as needed for pain relief.  Can also substitute Motrin as well.  If symptoms not improved at 2 weeks.  Make an appointment to follow-up with orthopedics.  MRI of back may be needed.  Return for any new or worse symptoms.

## 2020-07-26 ENCOUNTER — Emergency Department (HOSPITAL_COMMUNITY): Payer: Self-pay

## 2020-07-26 ENCOUNTER — Encounter (HOSPITAL_COMMUNITY): Payer: Self-pay

## 2020-07-26 ENCOUNTER — Emergency Department (HOSPITAL_COMMUNITY)
Admission: EM | Admit: 2020-07-26 | Discharge: 2020-07-26 | Disposition: A | Discharge status: Home / Self Care | Payer: Self-pay | Attending: Emergency Medicine | Admitting: Emergency Medicine

## 2020-07-26 ENCOUNTER — Other Ambulatory Visit: Payer: Self-pay

## 2020-07-26 DIAGNOSIS — F17210 Nicotine dependence, cigarettes, uncomplicated: Secondary | ICD-10-CM | POA: Insufficient documentation

## 2020-07-26 DIAGNOSIS — R042 Hemoptysis: Secondary | ICD-10-CM

## 2020-07-26 DIAGNOSIS — K0889 Other specified disorders of teeth and supporting structures: Secondary | ICD-10-CM

## 2020-07-26 LAB — CBC WITH DIFFERENTIAL/PLATELET
Abs Immature Granulocytes: 0.02 10*3/uL (ref 0.00–0.07)
Basophils Absolute: 0 10*3/uL (ref 0.0–0.1)
Basophils Relative: 1 %
Eosinophils Absolute: 0.2 10*3/uL (ref 0.0–0.5)
Eosinophils Relative: 2 %
HCT: 44.2 % (ref 39.0–52.0)
Hemoglobin: 15.1 g/dL (ref 13.0–17.0)
Immature Granulocytes: 0 %
Lymphocytes Relative: 13 %
Lymphs Abs: 1.1 10*3/uL (ref 0.7–4.0)
MCH: 31 pg (ref 26.0–34.0)
MCHC: 34.2 g/dL (ref 30.0–36.0)
MCV: 90.8 fL (ref 80.0–100.0)
Monocytes Absolute: 0.6 10*3/uL (ref 0.1–1.0)
Monocytes Relative: 7 %
Neutro Abs: 6.5 10*3/uL (ref 1.7–7.7)
Neutrophils Relative %: 77 %
Platelets: 295 10*3/uL (ref 150–400)
RBC: 4.87 MIL/uL (ref 4.22–5.81)
RDW: 11.9 % (ref 11.5–15.5)
WBC: 8.4 10*3/uL (ref 4.0–10.5)
nRBC: 0 % (ref 0.0–0.2)

## 2020-07-26 LAB — COMPREHENSIVE METABOLIC PANEL
ALT: 36 U/L (ref 0–44)
AST: 26 U/L (ref 15–41)
Albumin: 4.4 g/dL (ref 3.5–5.0)
Alkaline Phosphatase: 74 U/L (ref 38–126)
Anion gap: 4 — ABNORMAL LOW (ref 5–15)
BUN: 9 mg/dL (ref 6–20)
CO2: 25 mmol/L (ref 22–32)
Calcium: 9.2 mg/dL (ref 8.9–10.3)
Chloride: 105 mmol/L (ref 98–111)
Creatinine, Ser: 0.83 mg/dL (ref 0.61–1.24)
GFR, Estimated: >60 mL/min (ref >60)
Glucose, Bld: 110 mg/dL — ABNORMAL HIGH (ref 70–99)
Potassium: 4.1 mmol/L (ref 3.5–5.1)
Sodium: 134 mmol/L — ABNORMAL LOW (ref 135–145)
Total Bilirubin: 0.6 mg/dL (ref 0.3–1.2)
Total Protein: 7.2 g/dL (ref 6.5–8.1)

## 2020-07-26 LAB — TYPE AND SCREEN
ABO/RH(D): B NEG
Antibody Screen: NEGATIVE

## 2020-07-26 LAB — D-DIMER, QUANTITATIVE: D-Dimer, Quant: <0.27 ug/mL-FEU (ref 0.00–0.50)

## 2020-07-26 LAB — ABO/RH: ABO/RH(D): B NEG

## 2020-07-26 MED ORDER — HYDROXYZINE HCL 25 MG PO TABS: 25.0000 mg | ORAL_TABLET | Freq: Four times a day (QID) | ORAL | 0 refills | Status: AC

## 2020-07-26 MED ORDER — ALUM & MAG HYDROXIDE-SIMETH 200-200-20 MG/5ML PO SUSP
30.0000 mL | Freq: Once | ORAL | Status: AC
  Administered 2020-07-26: 30 mL via ORAL
  Filled 2020-07-26: qty 30

## 2020-07-26 MED ORDER — LIDOCAINE VISCOUS HCL 2 % MT SOLN
15.0000 mL | Freq: Once | OROMUCOSAL | Status: AC
  Administered 2020-07-26: 15 mL via ORAL
  Filled 2020-07-26: qty 15

## 2020-07-26 MED ORDER — BENZONATATE 100 MG PO CAPS: 100.0000 mg | ORAL_CAPSULE | Freq: Three times a day (TID) | ORAL | 0 refills | Status: DC

## 2020-07-26 MED ORDER — HYDROXYZINE HCL 25 MG PO TABS
50.0000 mg | ORAL_TABLET | Freq: Once | ORAL | Status: AC
  Administered 2020-07-26: 50 mg via ORAL
  Filled 2020-07-26: qty 2

## 2020-07-26 MED ORDER — SODIUM CHLORIDE 0.9 % IV BOLUS
1000.0000 mL | Freq: Once | INTRAVENOUS | Status: AC
  Administered 2020-07-26: 1000 mL via INTRAVENOUS

## 2020-07-26 MED ORDER — NICOTINE 21 MG/24HR TD PT24: 21.0000 mg | MEDICATED_PATCH | Freq: Every day | TRANSDERMAL | 0 refills | Status: AC

## 2020-07-26 MED ORDER — SULFAMETHOXAZOLE-TRIMETHOPRIM 800-160 MG PO TABS: 1.0000 | ORAL_TABLET | Freq: Two times a day (BID) | ORAL | 0 refills | Status: AC

## 2020-07-26 MED ORDER — NICOTINE 21 MG/24HR TD PT24
21.0000 mg | MEDICATED_PATCH | Freq: Once | TRANSDERMAL | Status: DC
  Administered 2020-07-26: 21 mg via TRANSDERMAL
  Filled 2020-07-26: qty 1

## 2020-07-26 NOTE — ED Provider Notes (Signed)
[facility]-EMERGENCY DEPT Provider Note   CSN: [id number] Arrival date & time: 07/26/20  0805     History Chief Complaint  Patient presents with  . Hemoptysis    Zachary Lozano is a 34 y.o. male.  HPI Patient is a 34 year old male who is a current smoker smokes a pack a day for the past 18 years uses occasional cocaine and occasional methamphetamine  Patient is presenting today with complaints of hemoptysis this morning.  He states he has had streaky bright red blood in his cough this morning he states that he was coughing intermittently for 20 minutes and experiencing hemoptysis.  He states after this period of time he has been producing clear sputum.  He states he has never had bright red blood per his sputum before.  He states he has had streaky pink sputum before however. He is a chronic smoker 1 pack/day since he was 34 years old. He has a chronic cough.  He denies any chest pain apart from "burning in my esophagus "that he states feels like his acid reflux.  He denies any shortness of breath, vomiting, epistaxis, mouth bleeding.  He states he does have a rotting tooth in his right lower gumline that he states is been causing some pain but states it is not bleeding.  No history of VTE.  No history of tuberculosis or exposure or travel.  He states he feels very anxious and is anxious at baseline.  He states that he has not used cocaine for 2 months has not used meth for 1 month.  No other associated symptoms.  No aggravating relieving factors.  Is symptoms are resolved at this time apart from the complaint of reflux.    No past medical history on file.  There are no problems to display for this patient.   Past Surgical History:  Procedure Laterality Date  . TOOTH EXTRACTION         Family History  Problem Relation Age of Onset  . Diabetes Mother   . Cancer Mother   . Emphysema Father   . Cirrhosis Father     Social History   Tobacco Use  .  Smoking status: Current Every Day Smoker    Packs/day: 1.00    Types: Cigarettes  . Smokeless tobacco: Never Used  Vaping Use  . Vaping Use: Former  Substance Use Topics  . Alcohol use: Yes    Comment: ocassionally  . Drug use: Not Currently    Types: Cocaine    Home Medications Prior to Admission medications   Medication Sig Start Date End Date Taking? Authorizing Provider  calcium carbonate (TUMS - DOSED IN MG ELEMENTAL CALCIUM) 500 MG chewable tablet Chew 5,000 mg by mouth 3 (three) times daily as needed for indigestion or heartburn.   Yes [provider]  HYDROcodone-acetaminophen (NORCO/VICODIN) 5-325 MG tablet Take 1 tablet by mouth every 6 (six) hours as needed for moderate pain. 06/04/20  Yes Vanetta Mulders, MD  benzonatate (TESSALON) 100 MG capsule Take 1 capsule (100 mg total) by mouth every 8 (eight) hours. 07/26/20   Gailen Shelter, PA  hydrOXYzine (ATARAX/VISTARIL) 25 MG tablet Take 1 tablet (25 mg total) by mouth every 6 (six) hours. 07/26/20   Gailen Shelter, PA  predniSONE (DELTASONE) 10 MG tablet Take 4 tablets (40 mg total) by mouth daily. Patient not taking: No sig reported 06/04/20   Vanetta Mulders, MD  sulfamethoxazole-trimethoprim (BACTRIM DS) 800-160 MG tablet Take 1 tablet by mouth  2 (two) times daily for 7 days. 07/26/20 08/02/20  Gailen Shelter, PA    Allergies    Patient has no known allergies.  Review of Systems   Review of Systems  Constitutional: Negative for chills and fever.  HENT: Negative for congestion.   Eyes: Negative for pain.  Respiratory: Positive for cough. Negative for shortness of breath.        Hemoptysis  Cardiovascular: Negative for chest pain and leg swelling.  Gastrointestinal: Negative for abdominal pain, diarrhea, nausea and vomiting.  Genitourinary: Negative for dysuria.  Musculoskeletal: Negative for myalgias.  Skin: Negative for rash.  Neurological: Negative for dizziness and headaches.    Physical  Exam Updated Vital Signs BP (!) 135/95 (BP Location: Left Arm)   Pulse 96   Temp 98.8 F (37.1 C) (Oral)   Resp 19   Ht 5\' 10"  (1.778 m)   Wt 83.9 kg   SpO2 99%   BMI 26.54 kg/m   Physical Exam Vitals and nursing note reviewed.  Constitutional:      General: He is not in acute distress.    Comments: Pleasant well-appearing 34 year old appears slightly older than stated age.  In no acute distress.  Sitting in bed, appears comfortable but anxious.  Able answer questions appropriately follow commands. No increased work of breathing. Speaking in full sentences.  HENT:     Head: Normocephalic and atraumatic.     Nose: Nose normal.  Eyes:     General: No scleral icterus. Cardiovascular:     Rate and Rhythm: Normal rate and regular rhythm.     Pulses: Normal pulses.     Heart sounds: Normal heart sounds.     Comments: HR 90 Pulmonary:     Effort: Pulmonary effort is normal. No respiratory distress.     Breath sounds: Normal breath sounds. No wheezing.  Abdominal:     Palpations: Abdomen is soft.     Tenderness: There is no abdominal tenderness. There is no guarding or rebound.  Musculoskeletal:     Cervical back: Normal range of motion.     Right lower leg: No edema.     Left lower leg: No edema.  Skin:    General: Skin is warm and dry.     Capillary Refill: Capillary refill takes less than 2 seconds.  Neurological:     Mental Status: He is alert. Mental status is at baseline.  Psychiatric:     Comments: Very anxious appearing     ED Results / Procedures / Treatments   Labs (all labs ordered are listed, but only abnormal results are displayed) Labs Reviewed  COMPREHENSIVE METABOLIC PANEL - Abnormal; Notable for the following components:      Result Value   Sodium 134 (*)    Glucose, Bld 110 (*)    Anion gap 4 (*)    All other components within normal limits  CBC WITH DIFFERENTIAL/PLATELET  D-DIMER, QUANTITATIVE (NOT AT [facility])  TYPE AND SCREEN  ABO/RH     EKG None  Radiology DG Chest Portable 1 View  Result Date: 07/26/2020 CLINICAL DATA:  cough, hemoptysis EXAM: PORTABLE CHEST 1 VIEW COMPARISON:  04/25/2012 FINDINGS: No focal consolidation. Mild peribronchial cuffing. No pneumothorax or pleural effusion. Cardiomediastinal silhouette is within normal limits. No acute osseous abnormality. IMPRESSION: No focal consolidation. Mild peribronchial cuffing may be seen in bronchitis or asthma. Electronically Signed   By: 06/25/2012 M.D.   On: 07/26/2020 09:20    Procedures Procedures (including critical care time)  ED ECG REPORT   Date: 07/26/2020  Rate: 96  Rhythm: normal sinus rhythm  QRS Axis: normal  Intervals: normal  ST/T Wave abnormalities: early repolarization  Conduction Disutrbances:none  Narrative Interpretation:   Old EKG Reviewed: unchanged  I have personally reviewed the EKG tracing and agree with the computerized printout as noted.  Medications Ordered in ED Medications  nicotine (NICODERM CQ - dosed in mg/24 hours) patch 21 mg (21 mg Transdermal Patch Applied 07/26/20 0957)  hydrOXYzine (ATARAX/VISTARIL) tablet 50 mg (50 mg Oral Given 07/26/20 0915)  sodium chloride 0.9 % bolus 1,000 mL (1,000 mLs Intravenous Bolus 07/26/20 0915)  alum & mag hydroxide-simeth (MAALOX/MYLANTA) 200-200-20 MG/5ML suspension 30 mL (30 mLs Oral Given 07/26/20 0915)    And  lidocaine (XYLOCAINE) 2 % viscous mouth solution 15 mL (15 mLs Oral Given 07/26/20 0915)    ED Course  I have reviewed the triage vital signs and the nursing notes.  Pertinent labs & imaging results that were available during my care of the patient were reviewed by me and considered in my medical decision making (see chart for details).    MDM Rules/Calculators/A&P                          Patient here with hemoptysis for approximately 20 minutes this morning.  Also complaining of reflux symptoms but denies any chest pain, nausea, vomiting, shortness of  breath.  Has had streaky bright red blood per sputum w/ hemoptysis this morning.  EKG reviewed by my attending physician Dr. Effie Shy.  Please see my EKG interpretation above.  Patient is mildly tachycardic in triage not tachycardic on my exam however.  Physical exam is unremarkable.  He does have decayed right lower molar.  There is no bleeding however.  CMP without any significant abnormality.  CBC without any leukocytosis or anemia.  Dimer negative, type and screen performed.  Chest x-ray unremarkable.  There is no masses or acute abnormalities.  EKG without any significant abnormalities as discussed above.  Patient reassessed after GI cocktail, fluids, Atarax nicotine patch.  He seems significantly more relaxed.  States he continues to feel well states that his reflux symptoms are now abated.  Discussed my attending physician.  My plan is to discharge home at this time with Bactrim, benzonatate, Vistaril.  He will follow closely with his primary care doctor he was given strict return precautions.  Higher suspicion for versus bronchitis versus other upper respiratory infection versus tracheitis--Bactrim to cover this.  Final Clinical Impression(s) / ED Diagnoses Final diagnoses:  Hemoptysis  Pain, dental    Rx / DC Orders ED Discharge Orders         Ordered    sulfamethoxazole-trimethoprim (BACTRIM DS) 800-160 MG tablet  2 times daily        07/26/20 1032    benzonatate (TESSALON) 100 MG capsule  Every 8 hours        07/26/20 1032    hydrOXYzine (ATARAX/VISTARIL) 25 MG tablet  Every 6 hours        07/26/20 1032           Solon [place], [place] 07/27/20 1009    Mancel Bale, MD 07/27/20 1953

## 2020-07-26 NOTE — ED Notes (Signed)
Additional pink top tube sent to lab per blood bank request

## 2020-07-26 NOTE — ED Triage Notes (Addendum)
Pt arrives POV from home reports that he began coughing up blood this am. Pt reports only having episodes this am. Pt denies [facility] or chest pain. Pt reports there is brown colored mucus in the sputum as well. Pt reports heavy tobacco use. Pt additionally reports a "burning esophagus" with acid reflux

## 2020-07-26 NOTE — Discharge Instructions (Addendum)
Your work-up was reassuring today.  Please follow-up with the [facility] for further evaluation and care.  Please drink plenty of water.  I provided you with an antibiotic to take please take with food.  This will provide some a preventative coverage for your dental pain as well as cover for infections and may be causing your bloody sputum.  I am also providing you with Vistaril/Atarax which is a medicine used to treat anxiety.  Please follow-up with the [facility] and discuss further anxiety treatment. I have also provided you with a list of psychiatry resources.  You may follow-up with a therapist and psychiatrist.  This may be helpful for the symptoms that you described to me including the jitteriness and  feeling amped up  You may always return to the ER for any new or concerning symptoms.

## 2020-08-02 ENCOUNTER — Other Ambulatory Visit: Payer: Self-pay

## 2020-08-02 ENCOUNTER — Emergency Department (HOSPITAL_COMMUNITY)
Admission: EM | Admit: 2020-08-02 | Discharge: 2020-08-03 | Disposition: A | Discharge status: Home / Self Care | Payer: Self-pay | Attending: Emergency Medicine | Admitting: Emergency Medicine

## 2020-08-02 DIAGNOSIS — S161XXA Strain of muscle, fascia and tendon at neck level, initial encounter: Secondary | ICD-10-CM | POA: Insufficient documentation

## 2020-08-02 DIAGNOSIS — F17210 Nicotine dependence, cigarettes, uncomplicated: Secondary | ICD-10-CM | POA: Insufficient documentation

## 2020-08-02 DIAGNOSIS — T148XXA Other injury of unspecified body region, initial encounter: Secondary | ICD-10-CM

## 2020-08-02 DIAGNOSIS — K047 Periapical abscess without sinus: Secondary | ICD-10-CM | POA: Insufficient documentation

## 2020-08-02 DIAGNOSIS — X58XXXA Exposure to other specified factors, initial encounter: Secondary | ICD-10-CM | POA: Insufficient documentation

## 2020-08-02 DIAGNOSIS — S29012A Strain of muscle and tendon of back wall of thorax, initial encounter: Secondary | ICD-10-CM | POA: Insufficient documentation

## 2020-08-03 ENCOUNTER — Encounter (HOSPITAL_COMMUNITY): Payer: Self-pay | Admitting: Emergency Medicine

## 2020-08-03 MED ORDER — LIDOCAINE 5 % EX PTCH: 1.0000 | MEDICATED_PATCH | CUTANEOUS | 0 refills | Status: DC

## 2020-08-03 MED ORDER — PENICILLIN V POTASSIUM 500 MG PO TABS
500.0000 mg | ORAL_TABLET | Freq: Once | ORAL | Status: AC
  Administered 2020-08-03: 500 mg via ORAL
  Filled 2020-08-03: qty 1

## 2020-08-03 MED ORDER — CYCLOBENZAPRINE HCL 10 MG PO TABS: 10.0000 mg | ORAL_TABLET | Freq: Three times a day (TID) | ORAL | 0 refills | Status: AC | PRN

## 2020-08-03 MED ORDER — IBUPROFEN 800 MG PO TABS
800.0000 mg | ORAL_TABLET | Freq: Once | ORAL | Status: AC
  Administered 2020-08-03: 800 mg via ORAL
  Filled 2020-08-03: qty 1

## 2020-08-03 MED ORDER — IBUPROFEN 800 MG PO TABS: 800.0000 mg | ORAL_TABLET | Freq: Three times a day (TID) | ORAL | 0 refills | Status: DC

## 2020-08-03 MED ORDER — CYCLOBENZAPRINE HCL 10 MG PO TABS
10.0000 mg | ORAL_TABLET | Freq: Once | ORAL | Status: AC
  Administered 2020-08-03: 10 mg via ORAL
  Filled 2020-08-03: qty 1

## 2020-08-03 MED ORDER — BUPIVACAINE-EPINEPHRINE (PF) 0.5% -1:200000 IJ SOLN
1.8000 mL | Freq: Once | INTRAMUSCULAR | Status: AC
  Administered 2020-08-03: 1.8 mL
  Filled 2020-08-03: qty 1.8

## 2020-08-03 MED ORDER — PENICILLIN V POTASSIUM 500 MG PO TABS: 500.0000 mg | ORAL_TABLET | Freq: Four times a day (QID) | ORAL | 0 refills | Status: DC

## 2020-08-03 NOTE — ED Provider Notes (Signed)
[facility]-EMERGENCY DEPT Provider Note   CSN: [id number] Arrival date & time: 08/02/20  2322     History Chief Complaint  Patient presents with  . Dental Pain    Zachary Lozano is a 34 y.o. male.  Patient presents to the emergency department with a chief complaint of right-sided dental pain. He states that he has been fighting a sore tooth for the past week or so. He also complains of right upper back and neck tightness. He denies any injury. Denies any fevers chills. Denies successful treatments prior to arrival. Denies any other associated symptoms.  The history is provided by the patient. No language interpreter was used.       History reviewed. No pertinent past medical history.  There are no problems to display for this patient.   Past Surgical History:  Procedure Laterality Date  . TOOTH EXTRACTION         Family History  Problem Relation Age of Onset  . Diabetes Mother   . Cancer Mother   . Emphysema Father   . Cirrhosis Father     Social History   Tobacco Use  . Smoking status: Current Every Day Smoker    Packs/day: 1.00    Types: Cigarettes  . Smokeless tobacco: Never Used  Vaping Use  . Vaping Use: Former  Substance Use Topics  . Alcohol use: Yes    Comment: ocassionally  . Drug use: Not Currently    Types: Cocaine    Home Medications Prior to Admission medications   Medication Sig Start Date End Date Taking? Authorizing Provider  benzonatate (TESSALON) 100 MG capsule Take 1 capsule (100 mg total) by mouth every 8 (eight) hours. 07/26/20   Gailen Shelter, PA  calcium carbonate (TUMS - DOSED IN MG ELEMENTAL CALCIUM) 500 MG chewable tablet Chew 5,000 mg by mouth 3 (three) times daily as needed for indigestion or heartburn.    [provider]  cyclobenzaprine (FLEXERIL) 10 MG tablet Take 1 tablet (10 mg total) by mouth 3 (three) times daily as needed for muscle spasms. 08/03/20   Roxy Horseman, PA-C   HYDROcodone-acetaminophen (NORCO/VICODIN) 5-325 MG tablet Take 1 tablet by mouth every 6 (six) hours as needed for moderate pain. 06/04/20   Vanetta Mulders, MD  hydrOXYzine (ATARAX/VISTARIL) 25 MG tablet Take 1 tablet (25 mg total) by mouth every 6 (six) hours. 07/26/20   Gailen Shelter, PA  ibuprofen (ADVIL) 800 MG tablet Take 1 tablet (800 mg total) by mouth 3 (three) times daily. 08/03/20   Roxy Horseman, PA-C  lidocaine (LIDODERM) 5 % Place 1 patch onto the skin daily. Remove & Discard patch within 12 hours or as directed by MD 08/03/20   Roxy Horseman, PA-C  nicotine (NICODERM CQ - DOSED IN MG/24 HOURS) 21 mg/24hr patch Place 1 patch (21 mg total) onto the skin daily. 07/26/20   Gailen Shelter, PA  penicillin v potassium (VEETID) 500 MG tablet Take 1 tablet (500 mg total) by mouth 4 (four) times daily. 08/03/20   Roxy Horseman, PA-C  predniSONE (DELTASONE) 10 MG tablet Take 4 tablets (40 mg total) by mouth daily. Patient not taking: No sig reported 06/04/20   Vanetta Mulders, MD    Allergies    Patient has no known allergies.  Review of Systems   Review of Systems  All other systems reviewed and are negative.   Physical Exam Updated Vital Signs BP 131/82 (BP Location: Right Arm)   Pulse 100  Temp 98.5 F (36.9 C) (Oral)   Resp 15   Ht 5\' 10"  (1.778 m)   Wt 83.9 kg   SpO2 97%   BMI 26.54 kg/m   Physical Exam Physical Exam  Constitutional: Pt appears well-developed and well-nourished.  HENT:  Head: Normocephalic.  Right Ear: Tympanic membrane, external ear and ear canal normal.  Left Ear: Tympanic membrane, external ear and ear canal normal.  Nose: Nose normal. Right sinus exhibits no maxillary sinus tenderness and no frontal sinus tenderness. Left sinus exhibits no maxillary sinus tenderness and no frontal sinus tenderness.  Mouth/Throat: Uvula is midline, oropharynx is clear and moist and mucous membranes are normal. No oral lesions. No uvula swelling or  lacerations. No oropharyngeal exudate, posterior oropharyngeal edema, posterior oropharyngeal erythema or tonsillar abscesses.  Poor dentition No gingival swelling, fluctuance or induration Small gingival abscess  No sublingual edema, tenderness to palpation, or sign of Ludwig's angina, or deep space infection Pain at right lower molar Eyes: Conjunctivae are normal. Pupils are equal, round, and reactive to light. Right eye exhibits no discharge. Left eye exhibits no discharge.  Neck: Normal range of motion. Neck supple.  No stridor Handling secretions without difficulty No nuchal rigidity No cervical lymphadenopathy Cardiovascular: Normal rate, regular rhythm and normal heart sounds.   Pulmonary/Chest: Effort normal. No respiratory distress.  Equal chest rise  Abdominal: Soft. Bowel sounds are normal. Pt exhibits no distension. There is no tenderness.  Lymphadenopathy: Pt has no cervical adenopathy.  Neurological: Pt is alert and oriented x 4  Skin: Skin is warm and dry.  Psychiatric: Pt has a normal mood and affect.  Nursing note and vitals reviewed.  ED Results / Procedures / Treatments   Labs (all labs ordered are listed, but only abnormal results are displayed) Labs Reviewed - No data to display  EKG None  Radiology No results found.  Procedures . Incision and Drainage  Date/Time: 08/03/2020 2:45 AM Performed by: 08/05/2020, PA-C Authorized by: Roxy Horseman, PA-C   Consent:    Consent obtained:  Verbal   Consent given by:  Patient   Risks discussed:  Bleeding, incomplete drainage, pain and damage to other organs   Alternatives discussed:  No treatment Universal protocol:    Procedure explained and questions answered to patient or proxy's satisfaction: yes     Relevant documents present and verified: yes     Test results available : yes     Imaging studies available: yes     Required blood products, implants, devices, and special equipment available: yes      Site/side marked: yes     Immediately prior to procedure, a time out was called: yes     Patient identity confirmed:  Verbally with patient Location:    Type:  Abscess   Location:  Mouth   Mouth location:  Alveolar process Sedation:    Sedation type:  None Anesthesia:    Anesthesia method:  Local infiltration   Local anesthetic:  Bupivacaine 0.25% WITH epi Procedure type:    Complexity:  Simple Procedure details:    Incision types:  Single straight and stab incision   Scalpel blade:  11 Post-procedure details:    Procedure completion:  Tolerated well, no immediate complications   (including critical care time)  Medications Ordered in ED Medications  penicillin v potassium (VEETID) tablet 500 mg (has no administration in time range)  bupivacaine-epinephrine (MARCAINE W/ EPI) 0.5% -1:200000 injection 1.8 mL (1.8 mLs Infiltration Given by Other 08/03/20 0232)  ibuprofen (  ADVIL) tablet 800 mg (800 mg Oral Given 08/03/20 0232)  cyclobenzaprine (FLEXERIL) tablet 10 mg (10 mg Oral Given 08/03/20 0232)    ED Course  I have reviewed the triage vital signs and the nursing notes.  Pertinent labs & imaging results that were available during my care of the patient were reviewed by me and considered in my medical decision making (see chart for details).    MDM Rules/Calculators/A&P                          Patient with dentalgia. Exam not concerning for Ludwig's angina or pharyngeal abscess.  Will treat with penicillin. Pt instructed to follow-up with dentist.  Discussed return precautions. Pt safe for discharge.  Right sided upper trapezius muscle is tight, no cellulitis, abscess, or crepitus. Will treat for muscle tension.  Final Clinical Impression(s) / ED Diagnoses Final diagnoses:  Dental abscess  Muscle strain    Rx / DC Orders ED Discharge Orders         Ordered    penicillin v potassium (VEETID) 500 MG tablet  4 times daily        08/03/20 0242    cyclobenzaprine  (FLEXERIL) 10 MG tablet  3 times daily PRN        08/03/20 0242    ibuprofen (ADVIL) 800 MG tablet  3 times daily        08/03/20 0242    lidocaine (LIDODERM) 5 %  Every 24 hours        08/03/20 0242           Roxy Horseman, PA-C 08/03/20 0247    Nira Conn, MD 08/05/20 1032

## 2020-08-03 NOTE — ED Triage Notes (Signed)
Patient is complaining of right lower mouth pain and neck pain from being tense from dental pain. Patient states he is on an antibiotic but it is not working.

## 2021-03-03 ENCOUNTER — Emergency Department (HOSPITAL_COMMUNITY)
Admission: EM | Admit: 2021-03-03 | Discharge: 2021-03-03 | Disposition: A | Discharge status: Home / Self Care | Payer: BC Managed Care – PPO | Attending: Emergency Medicine | Admitting: Emergency Medicine

## 2021-03-03 ENCOUNTER — Other Ambulatory Visit: Payer: Self-pay

## 2021-03-03 ENCOUNTER — Encounter (HOSPITAL_COMMUNITY): Payer: Self-pay

## 2021-03-03 DIAGNOSIS — T148XXA Other injury of unspecified body region, initial encounter: Secondary | ICD-10-CM

## 2021-03-03 DIAGNOSIS — X501XXA Overexertion from prolonged static or awkward postures, initial encounter: Secondary | ICD-10-CM | POA: Insufficient documentation

## 2021-03-03 DIAGNOSIS — F17210 Nicotine dependence, cigarettes, uncomplicated: Secondary | ICD-10-CM | POA: Insufficient documentation

## 2021-03-03 DIAGNOSIS — Y9339 Activity, other involving climbing, rappelling and jumping off: Secondary | ICD-10-CM | POA: Insufficient documentation

## 2021-03-03 DIAGNOSIS — S39012A Strain of muscle, fascia and tendon of lower back, initial encounter: Secondary | ICD-10-CM | POA: Insufficient documentation

## 2021-03-03 DIAGNOSIS — S3992XA Unspecified injury of lower back, initial encounter: Secondary | ICD-10-CM | POA: Diagnosis present

## 2021-03-03 MED ORDER — METHOCARBAMOL 500 MG PO TABS: 500.0000 mg | ORAL_TABLET | Freq: Two times a day (BID) | ORAL | 0 refills | Status: AC

## 2021-03-03 MED ORDER — LIDOCAINE 5 % EX PTCH
1.0000 | MEDICATED_PATCH | CUTANEOUS | Status: DC
  Administered 2021-03-03: 1 via TRANSDERMAL
  Filled 2021-03-03: qty 1

## 2021-03-03 MED ORDER — LIDOCAINE 5 % EX PTCH: 1.0000 | MEDICATED_PATCH | CUTANEOUS | 0 refills | Status: AC

## 2021-03-03 MED ORDER — IBUPROFEN 800 MG PO TABS
800.0000 mg | ORAL_TABLET | Freq: Once | ORAL | Status: AC
  Administered 2021-03-03: 800 mg via ORAL
  Filled 2021-03-03: qty 1

## 2021-03-03 NOTE — ED Triage Notes (Signed)
Patient c/o right upper back pain and swelling near the right scapula x 1 week.

## 2021-03-03 NOTE — Discharge Instructions (Addendum)
You were seen in the emergency department today for your back pain.  Your physical exam and vital signs are very reassuring.  The muscles in your mid back are in what is called spasm, meaning they are inappropriately tightened up.  This can be quite painful.  To help with your pain you may take Tylenol and / or NSAID medication (such as ibuprofen or naproxen) to help with your pain.  Additionally, you have been prescribed a muscle relaxer called Robaxin to help relieve some of the muscle spasm.  Please be advised that this medication may make you very sleepy, so you should not drive or operate heavy machinery while you are taking it.  You may also utilize topical pain relief such as Biofreeze, IcyHot, or topical lidocaine patches.  I also recommend that you apply heat to the area, such as a hot shower or heating pad, and follow heat application with massage of the muscles that are most tight.  Please return to the emergency department if you develop any numbness/tingling/weakness in your arms or legs, any difficulty urinating, or urinary incontinence chest pain, shortness of breath, abdominal pain, nausea or vomiting that does not stop, or any other new severe symptoms.
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
## 2021-04-22 ENCOUNTER — Other Ambulatory Visit: Payer: Self-pay

## 2021-04-22 ENCOUNTER — Emergency Department (HOSPITAL_COMMUNITY)
Admission: EM | Admit: 2021-04-22 | Discharge: 2021-04-22 | Disposition: A | Discharge status: Home / Self Care | Payer: BC Managed Care – PPO | Attending: Emergency Medicine | Admitting: Emergency Medicine

## 2021-04-22 ENCOUNTER — Encounter (HOSPITAL_COMMUNITY): Payer: Self-pay | Admitting: *Deleted

## 2021-04-22 DIAGNOSIS — U071 COVID-19: Secondary | ICD-10-CM | POA: Insufficient documentation

## 2021-04-22 DIAGNOSIS — F17210 Nicotine dependence, cigarettes, uncomplicated: Secondary | ICD-10-CM | POA: Insufficient documentation

## 2021-04-22 DIAGNOSIS — Z28310 Unvaccinated for covid-19: Secondary | ICD-10-CM | POA: Insufficient documentation

## 2021-04-22 LAB — CBC WITH DIFFERENTIAL/PLATELET
Abs Immature Granulocytes: 0.03 10*3/uL (ref 0.00–0.07)
Basophils Absolute: 0 10*3/uL (ref 0.0–0.1)
Basophils Relative: 0 %
Eosinophils Absolute: 0.1 10*3/uL (ref 0.0–0.5)
Eosinophils Relative: 1 %
HCT: 42.6 % (ref 39.0–52.0)
Hemoglobin: 14.3 g/dL (ref 13.0–17.0)
Immature Granulocytes: 0 %
Lymphocytes Relative: 4 %
Lymphs Abs: 0.3 10*3/uL — ABNORMAL LOW (ref 0.7–4.0)
MCH: 30.2 pg (ref 26.0–34.0)
MCHC: 33.6 g/dL (ref 30.0–36.0)
MCV: 90.1 fL (ref 80.0–100.0)
Monocytes Absolute: 1 10*3/uL (ref 0.1–1.0)
Monocytes Relative: 15 %
Neutro Abs: 5.5 10*3/uL (ref 1.7–7.7)
Neutrophils Relative %: 80 %
Platelets: 235 10*3/uL (ref 150–400)
RBC: 4.73 MIL/uL (ref 4.22–5.81)
RDW: 12.3 % (ref 11.5–15.5)
WBC: 6.9 10*3/uL (ref 4.0–10.5)
nRBC: 0 % (ref 0.0–0.2)

## 2021-04-22 LAB — BASIC METABOLIC PANEL
Anion gap: 9 (ref 5–15)
BUN: 11 mg/dL (ref 6–20)
CO2: 25 mmol/L (ref 22–32)
Calcium: 9.3 mg/dL (ref 8.9–10.3)
Chloride: 102 mmol/L (ref 98–111)
Creatinine, Ser: 0.93 mg/dL (ref 0.61–1.24)
GFR, Estimated: >60 mL/min (ref >60)
Glucose, Bld: 126 mg/dL — ABNORMAL HIGH (ref 70–99)
Potassium: 3.3 mmol/L — ABNORMAL LOW (ref 3.5–5.1)
Sodium: 136 mmol/L (ref 135–145)

## 2021-04-22 LAB — RESP PANEL BY RT-PCR (FLU A&B, COVID) ARPGX2
Influenza A by PCR: NEGATIVE
Influenza B by PCR: NEGATIVE
SARS Coronavirus 2 by RT PCR: POSITIVE — AB

## 2021-04-22 MED ORDER — HYDROCODONE-ACETAMINOPHEN 5-325 MG PO TABS
1.0000 | ORAL_TABLET | Freq: Once | ORAL | Status: AC
  Administered 2021-04-22: 1 via ORAL
  Filled 2021-04-22: qty 1

## 2021-04-22 MED ORDER — NIRMATRELVIR/RITONAVIR (PAXLOVID)TABLET: 3.0000 | ORAL_TABLET | Freq: Two times a day (BID) | ORAL | 0 refills | Status: AC

## 2021-04-22 MED ORDER — METOCLOPRAMIDE HCL 5 MG/ML IJ SOLN
10.0000 mg | Freq: Once | INTRAMUSCULAR | Status: AC
  Administered 2021-04-22: 10 mg via INTRAVENOUS
  Filled 2021-04-22: qty 2

## 2021-04-22 MED ORDER — KETOROLAC TROMETHAMINE 30 MG/ML IJ SOLN
30.0000 mg | Freq: Once | INTRAMUSCULAR | Status: AC
  Administered 2021-04-22: 30 mg via INTRAVENOUS
  Filled 2021-04-22: qty 1

## 2021-04-22 MED ORDER — SODIUM CHLORIDE 0.9 % IV BOLUS
1000.0000 mL | Freq: Once | INTRAVENOUS | Status: AC
  Administered 2021-04-22: 1000 mL via INTRAVENOUS

## 2021-04-22 NOTE — ED Provider Notes (Signed)
[facility]   CSN: [id number] Arrival date & time: 04/22/21  1349     History Chief Complaint  Patient presents with   Headache   Back Pain   Dizziness    Zachary Lozano is a 35 y.o. male.  He is complaining of acute illness symptoms that started yesterday.  Complaining of headache backache nausea decreased appetite general fatigue, fevers and chills.  He is not COVID vaccinated.  He has tried some muscle relaxant without any improvement.  The history is provided by the patient.  Influenza Presenting symptoms: fatigue, fever, headache, myalgias and nausea   Presenting symptoms: no shortness of breath and no sore throat   Headaches:    Severity:  Severe   Onset quality:  Gradual   Duration:  18 hours   Timing:  Constant   Progression:  Unchanged   Chronicity:  New Myalgias:    Location:  Back   Quality:  Aching   Severity:  Severe Severity:  Moderate Onset quality:  Gradual Progression:  Unchanged Chronicity:  New Relieved by:  Nothing Worsened by:  Nothing Ineffective treatments:  None tried Associated symptoms: chills   Associated symptoms: no mental status change       History reviewed. No pertinent past medical history.  There are no problems to display for this patient.   Past Surgical History:  Procedure Laterality Date   TOOTH EXTRACTION         Family History  Problem Relation Age of Onset   Diabetes Mother    Cancer Mother    Emphysema Father    Cirrhosis Father     Social History   Tobacco Use   Smoking status: Every Day    Packs/day: 0.50    Types: Cigarettes   Smokeless tobacco: Never  Vaping Use   Vaping Use: Former  Substance Use Topics   Alcohol use: Yes    Comment: ocassionally   Drug use: Not Currently    Types: Cocaine    Home Medications Prior to Admission medications   Medication Sig Start Date End Date Taking? Authorizing Provider  benzonatate (TESSALON) 100 MG  capsule Take 1 capsule (100 mg total) by mouth every 8 (eight) hours. 07/26/20   Gailen Shelter, PA  calcium carbonate (TUMS - DOSED IN MG ELEMENTAL CALCIUM) 500 MG chewable tablet Chew 5,000 mg by mouth 3 (three) times daily as needed for indigestion or heartburn.    [provider]  cyclobenzaprine (FLEXERIL) 10 MG tablet Take 1 tablet (10 mg total) by mouth 3 (three) times daily as needed for muscle spasms. 08/03/20   Roxy Horseman, PA-C  HYDROcodone-acetaminophen (NORCO/VICODIN) 5-325 MG tablet Take 1 tablet by mouth every 6 (six) hours as needed for moderate pain. 06/04/20   Vanetta Mulders, MD  hydrOXYzine (ATARAX/VISTARIL) 25 MG tablet Take 1 tablet (25 mg total) by mouth every 6 (six) hours. 07/26/20   Gailen Shelter, PA  ibuprofen (ADVIL) 800 MG tablet Take 1 tablet (800 mg total) by mouth 3 (three) times daily. 08/03/20   Roxy Horseman, PA-C  lidocaine (LIDODERM) 5 % Place 1 patch onto the skin daily. Remove & Discard patch within 12 hours or as directed by MD 03/03/21   Sponseller, Eugene Gavia, PA-C  methocarbamol (ROBAXIN) 500 MG tablet Take 1 tablet (500 mg total) by mouth 2 (two) times daily. 03/03/21   Sponseller, Lupe Carney R, PA-C  nicotine (NICODERM CQ - DOSED IN MG/24 HOURS) 21 mg/24hr patch Place 1  patch (21 mg total) onto the skin daily. 07/26/20   Gailen Shelter, PA  penicillin v potassium (VEETID) 500 MG tablet Take 1 tablet (500 mg total) by mouth 4 (four) times daily. 08/03/20   Roxy Horseman, PA-C  predniSONE (DELTASONE) 10 MG tablet Take 4 tablets (40 mg total) by mouth daily. Patient not taking: No sig reported 06/04/20   Vanetta Mulders, MD    Allergies    Patient has no known allergies.  Review of Systems   Review of Systems  Constitutional:  Positive for appetite change, chills, fatigue and fever.  HENT:  Negative for sore throat.   Eyes:  Positive for photophobia.  Respiratory:  Negative for shortness of breath.   Cardiovascular:  Negative for  chest pain.  Gastrointestinal:  Positive for nausea. Negative for abdominal pain.  Genitourinary:  Negative for dysuria.  Musculoskeletal:  Positive for back pain and myalgias.  Skin:  Negative for rash.  Neurological:  Positive for headaches.   Physical Exam Updated Vital Signs BP 120/87 (BP Location: Left Arm)   Pulse 97   Temp 99 F (37.2 C) (Oral)   Resp 18   Ht 5\' 8"  (1.727 m)   Wt 81.6 kg   SpO2 100%   BMI 27.37 kg/m   Physical Exam Vitals and nursing note reviewed.  Constitutional:      Appearance: He is well-developed.  HENT:     Head: Normocephalic and atraumatic.  Eyes:     Conjunctiva/sclera: Conjunctivae normal.  Cardiovascular:     Rate and Rhythm: Normal rate and regular rhythm.     Heart sounds: No murmur heard. Pulmonary:     Effort: Pulmonary effort is normal. No respiratory distress.     Breath sounds: Normal breath sounds.  Abdominal:     Palpations: Abdomen is soft.     Tenderness: There is no abdominal tenderness.  Musculoskeletal:     Cervical back: Neck supple.  Skin:    General: Skin is warm and dry.  Neurological:     Mental Status: He is alert.     GCS: GCS eye subscore is 4. GCS verbal subscore is 5. GCS motor subscore is 6.     Cranial Nerves: No cranial nerve deficit or dysarthria.     Sensory: No sensory deficit.     Motor: No weakness.    ED Results / Procedures / Treatments   Labs (all labs ordered are listed, but only abnormal results are displayed) Labs Reviewed  RESP PANEL BY RT-PCR (FLU A&B, COVID) ARPGX2 - Abnormal; Notable for the following components:      Result Value   SARS Coronavirus 2 by RT PCR POSITIVE (*)    All other components within normal limits  BASIC METABOLIC PANEL - Abnormal; Notable for the following components:   Potassium 3.3 (*)    Glucose, Bld 126 (*)    All other components within normal limits  CBC WITH DIFFERENTIAL/PLATELET - Abnormal; Notable for the following components:   Lymphs Abs 0.3 (*)     All other components within normal limits    EKG None  Radiology No results found.  Procedures Procedures   Medications Ordered in ED Medications  metoCLOPramide (REGLAN) injection 10 mg (has no administration in time range)  ketorolac (TORADOL) 30 MG/ML injection 30 mg (has no administration in time range)  sodium chloride 0.9 % bolus 1,000 mL (has no administration in time range)    ED Course  I have reviewed the triage vital signs  and the nursing notes.  Pertinent labs & imaging results that were available during my care of the patient were reviewed by me and considered in my medical decision making (see chart for details).  Clinical Course as of 04/23/21 9811  Tue Apr 22, 2021  1841 Reviewed with pharmacy, patient's Adderall is not a contraindication to prescribing Paxilovid. [MB]  1913 Reviewed results of work-up with him.  He is feeling better.  Counseled to isolate and follow-up in the post-COVID clinic.  Return instructions discussed [MB]    Clinical Course User Index [MB] Terrilee Files, MD   MDM Rules/Calculators/A&P                          Zachary Lozano was evaluated in Emergency Department on 04/22/2021 for the symptoms described in the history of present illness. He was evaluated in the context of the global COVID-19 pandemic, which necessitated consideration that the patient might be at risk for infection with the SARS-CoV-2 virus that causes COVID-19. Institutional protocols and algorithms that pertain to the evaluation of patients at risk for COVID-19 are in a state of rapid change based on information released by regulatory bodies including the CDC and federal and state organizations. These policies and algorithms were followed during the patient's care in the ED.  This patient complains of headache back pain nausea; this involves an extensive number of treatment Options and is a complaint that carries with it a high risk of complications and Morbidity.  The differential includes COVID, viral syndrome, meningitis, infection  I ordered, reviewed and interpreted labs, which included CBC with normal white count normal hemoglobin, chemistries normal other then low potassium elevated glucose, COVID-positive I ordered medication IV fluids migraine cocktail with improvement in his symptoms Previous records obtained and reviewed in epic no recent admissions  After the interventions stated above, I reevaluated the patient and found patient to be oxygenating well.  No indications for admission at this time.  Reviewed symptomatic treatment and return instructions discussed  Final Clinical Impression(s) / ED Diagnoses Final diagnoses:  COVID-19 virus infection    Rx / DC Orders ED Discharge Orders          Ordered    nirmatrelvir/ritonavir EUA (PAXLOVID) 20 x 150 MG & 10 x 100MG  TABS  2 times daily        04/22/21 1914             06/22/21, MD 04/23/21 [phone]

## 2021-04-22 NOTE — Discharge Instructions (Addendum)
You were seen in the emergency department for evaluation of headache body aches fever and chills.  You tested positive for COVID.  You need to isolate for at least 5 days from the beginning of your symptoms.  We are prescribing you a monoclonal antibody medication which may help your symptoms.  There is a COVID clinic at [facility] that you can follow-up with.  Return to the emergency department if any worsening or concerning symptoms

## 2021-04-22 NOTE — ED Triage Notes (Signed)
Pt states he woke up this morning with headache, back pain, chills, blurry vision/photophobia.

## 2021-04-22 NOTE — ED Provider Notes (Signed)
Emergency Medicine Provider Triage Evaluation Note  Zachary Lozano , a 35 y.o. male  was evaluated in triage.  Pt complains of headache, back pain, body aches, chills, fatigue, lightheadedness.  States his symptoms all started this morning.  Denies any history of known COVID-19 infection and states that he has not been vaccinated for COVID-19.  Denies any chest pain or shortness of breath.  Reports diffuse abdominal pain with intermittent nausea/vomiting.  Physical Exam  BP 133/68 (BP Location: Left Arm)   Pulse 99   Temp 99.1 F (37.3 C) (Oral)   Resp 18   Ht 5\' 8"  (1.727 m)   Wt 81.6 kg   SpO2 100%   BMI 27.37 kg/m  Gen:   Awake, no distress   Resp:  Normal effort  MSK:   Moves extremities without difficulty  Other:    Medical Decision Making  Medically screening exam initiated at 2:23 PM.  Appropriate orders placed.  was informed that the remainder of the evaluation will be completed by another provider, this initial triage assessment does not replace that evaluation, and the importance of remaining in the ED until their evaluation is complete.   [name], PA-C 04/22/21 1424    06/22/21, MD 04/22/21 [phone]

## 2021-06-12 ENCOUNTER — Encounter (HOSPITAL_COMMUNITY): Payer: Self-pay | Admitting: Emergency Medicine

## 2021-06-12 ENCOUNTER — Emergency Department (HOSPITAL_COMMUNITY)
Admission: EM | Admit: 2021-06-12 | Discharge: 2021-06-12 | Disposition: A | Discharge status: Home / Self Care | Payer: No Typology Code available for payment source | Attending: Emergency Medicine | Admitting: Emergency Medicine

## 2021-06-12 ENCOUNTER — Emergency Department (HOSPITAL_COMMUNITY): Payer: No Typology Code available for payment source

## 2021-06-12 DIAGNOSIS — Y92410 Unspecified street and highway as the place of occurrence of the external cause: Secondary | ICD-10-CM | POA: Diagnosis not present

## 2021-06-12 DIAGNOSIS — M542 Cervicalgia: Secondary | ICD-10-CM | POA: Diagnosis present

## 2021-06-12 DIAGNOSIS — M549 Dorsalgia, unspecified: Secondary | ICD-10-CM | POA: Diagnosis not present

## 2021-06-12 DIAGNOSIS — R519 Headache, unspecified: Secondary | ICD-10-CM | POA: Diagnosis not present

## 2021-06-12 DIAGNOSIS — Z5321 Procedure and treatment not carried out due to patient leaving prior to being seen by health care provider: Secondary | ICD-10-CM | POA: Diagnosis not present

## 2021-06-12 NOTE — ED Notes (Signed)
Pt states they are leaving  
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
## 2021-06-12 NOTE — ED Notes (Signed)
Pt noted to be loud, waving arms in the waiting room and stating frustration with wait and desire to get results.  This RN spoke with pt to acknowledge frustrations and to explain that pt was encouraged to wait to see provider to receive results but was free to make that decision for himself.  THis RN offered to assess pain; pt refused.

## 2021-10-30 ENCOUNTER — Encounter (HOSPITAL_COMMUNITY): Payer: Self-pay | Admitting: Emergency Medicine

## 2021-10-30 ENCOUNTER — Other Ambulatory Visit: Payer: Self-pay

## 2021-10-30 ENCOUNTER — Ambulatory Visit (HOSPITAL_COMMUNITY)
Admission: EM | Admit: 2021-10-30 | Discharge: 2021-10-30 | Disposition: A | Discharge status: Home / Self Care | Payer: Self-pay | Attending: Physician Assistant | Admitting: Physician Assistant

## 2021-10-30 DIAGNOSIS — K047 Periapical abscess without sinus: Secondary | ICD-10-CM

## 2021-10-30 MED ORDER — AMOXICILLIN-POT CLAVULANATE 875-125 MG PO TABS: 1.0000 | ORAL_TABLET | Freq: Two times a day (BID) | ORAL | 0 refills | Status: AC

## 2021-10-30 NOTE — ED Provider Notes (Signed)
?[facility] ? ? ? ?CSN: [id number] ?Arrival date & time: 10/30/21  1801 ? ? ?  ? ?History   ?Chief Complaint ?Chief Complaint  ?Patient presents with  ? Dental Pain  ? Abscess  ? ? ?HPI ?Zachary Lozano is a 36 y.o. male.  ? ?Patient presents today with a 1 week history of worsening right lower dental pain.  He has broken teeth in this area and has had a history of recurrent infections.  He denies any recent antibiotic use.  He does report having a fever with Tmax of 101 ?F.  He has been using ibuprofen with temporary improvement of symptoms.  Pain is rated 5 on a 0-10 pain scale, localized to affected area, described as throbbing, no aggravating relieving factors identified.  Denies any nausea, vomiting, body aches, weakness, malaise.  Denies any muffled voice, difficulty speaking, shortness of breath, swelling of throat, dysphagia.  He has missed work as a result of symptoms. ? ? ?History reviewed. No pertinent past medical history. ? ?There are no problems to display for this patient. ? ? ?Past Surgical History:  ?Procedure Laterality Date  ? TOOTH EXTRACTION    ? ? ? ? ? ?Home Medications   ? ?Prior to Admission medications   ?Medication Sig Start Date End Date Taking? Authorizing Provider  ?amoxicillin-clavulanate (AUGMENTIN) 875-125 MG tablet Take 1 tablet by mouth every 12 (twelve) hours for 10 days. 10/30/21 11/09/21 Yes Alexandrina Fiorini, Noberto RetortErin K, PA-C  ?benzonatate (TESSALON) 100 MG capsule Take 1 capsule (100 mg total) by mouth every 8 (eight) hours. 07/26/20   Gailen ShelterFondaw, Wylder S, PA  ?calcium carbonate (TUMS - DOSED IN MG ELEMENTAL CALCIUM) 500 MG chewable tablet Chew 5,000 mg by mouth 3 (three) times daily as needed for indigestion or heartburn.    [provider]  ?cyclobenzaprine (FLEXERIL) 10 MG tablet Take 1 tablet (10 mg total) by mouth 3 (three) times daily as needed for muscle spasms. 08/03/20   Roxy HorsemanBrowning, Robert, PA-C  ?HYDROcodone-acetaminophen (NORCO/VICODIN) 5-325 MG tablet Take 1 tablet by  mouth every 6 (six) hours as needed for moderate pain. 06/04/20   Vanetta MuldersZackowski, Scott, MD  ?hydrOXYzine (ATARAX/VISTARIL) 25 MG tablet Take 1 tablet (25 mg total) by mouth every 6 (six) hours. 07/26/20   Gailen ShelterFondaw, Wylder S, PA  ?ibuprofen (ADVIL) 800 MG tablet Take 1 tablet (800 mg total) by mouth 3 (three) times daily. 08/03/20   Roxy HorsemanBrowning, Robert, PA-C  ?lidocaine (LIDODERM) 5 % Place 1 patch onto the skin daily. Remove & Discard patch within 12 hours or as directed by MD 03/03/21   Sponseller, Eugene Gaviaebekah R, PA-C  ?methocarbamol (ROBAXIN) 500 MG tablet Take 1 tablet (500 mg total) by mouth 2 (two) times daily. 03/03/21   Sponseller, Lupe Carneyebekah R, PA-C  ?nicotine (NICODERM CQ - DOSED IN MG/24 HOURS) 21 mg/24hr patch Place 1 patch (21 mg total) onto the skin daily. 07/26/20   Gailen ShelterFondaw, Wylder S, PA  ?predniSONE (DELTASONE) 10 MG tablet Take 4 tablets (40 mg total) by mouth daily. ?Patient not taking: No sig reported 06/04/20   Vanetta MuldersZackowski, Scott, MD  ? ? ?Family History ?Family History  ?Problem Relation Age of Onset  ? Diabetes Mother   ? Cancer Mother   ? Emphysema Father   ? Cirrhosis Father   ? ? ?Social History ?Social History  ? ?Tobacco Use  ? Smoking status: Every Day  ?  Packs/day: 0.50  ?  Types: Cigarettes  ? Smokeless tobacco: Never  ?Vaping Use  ? Vaping  Use: Former  ?Substance Use Topics  ? Alcohol use: Yes  ?  Comment: ocassionally  ? Drug use: Not Currently  ?  Types: Cocaine  ? ? ? ?Allergies   ?Patient has no known allergies. ? ? ?Review of Systems ?Review of Systems  ?Constitutional:  Positive for activity change and fever. Negative for appetite change and fatigue.  ?HENT:  Positive for dental problem. Negative for congestion, sore throat, trouble swallowing and voice change.   ?Respiratory:  Negative for cough and shortness of breath.   ?Cardiovascular:  Negative for chest pain.  ?Gastrointestinal:  Negative for abdominal pain, diarrhea, nausea and vomiting.  ?Neurological:  Positive for headaches. Negative for  dizziness and light-headedness.  ? ? ?Physical Exam ?Triage Vital Signs ?ED Triage Vitals  ?Enc Vitals Group  ?   BP 10/30/21 1815 133/64  ?   Pulse Rate 10/30/21 1815 (!) 110  ?   Resp 10/30/21 1815 16  ?   Temp 10/30/21 1815 98 ?F (36.7 ?C)  ?   Temp Source 10/30/21 1815 Oral  ?   SpO2 10/30/21 1815 96 %  ?   Weight 10/30/21 1813 179 lb 14.3 oz (81.6 kg)  ?   Height 10/30/21 1813 5\' 8"  (1.727 m)  ?   Head Circumference --   ?   Peak Flow --   ?   Pain Score 10/30/21 1812 5  ?   Pain Loc --   ?   Pain Edu? --   ?   Excl. in GC? --   ? ?No data found. ? ?Updated Vital Signs ?BP 133/64 (BP Location: Left Arm)   Pulse (!) 110   Temp 98 ?F (36.7 ?C) (Oral)   Resp 16   Ht 5\' 8"  (1.727 m)   Wt 179 lb 14.3 oz (81.6 kg)   SpO2 96%   BMI 27.35 kg/m?  ? ?Visual Acuity ?Right Eye Distance:   ?Left Eye Distance:   ?Bilateral Distance:   ? ?Right Eye Near:   ?Left Eye Near:    ?Bilateral Near:    ? ?Physical Exam ?Vitals reviewed.  ?Constitutional:   ?   General: He is awake.  ?   Appearance: Normal appearance. He is well-developed. He is not ill-appearing.  ?   Comments: Very pleasant male appears stated age in no acute distress obviously uncomfortable holding right side of jaw  ?HENT:  ?   Head: Normocephalic and atraumatic.  ?   Right Ear: External ear normal.  ?   Left Ear: External ear normal.  ?   Nose: Nose normal.  ?   Mouth/Throat:  ?   Dentition: Abnormal dentition. Dental tenderness, gingival swelling, dental caries and dental abscesses present.  ?   Pharynx: Uvula midline. No oropharyngeal exudate, posterior oropharyngeal erythema or uvula swelling.  ? ?   Comments: No evidence of Ludwig angina ?Cardiovascular:  ?   Rate and Rhythm: Normal rate and regular rhythm.  ?   Heart sounds: Normal heart sounds, S1 normal and S2 normal. No murmur heard. ?Pulmonary:  ?   Effort: Pulmonary effort is normal. No accessory muscle usage or respiratory distress.  ?   Breath sounds: Normal breath sounds. No stridor. No  wheezing, rhonchi or rales.  ?   Comments: Clear to auscultation bilaterally ?Neurological:  ?   Mental Status: He is alert.  ?Psychiatric:     ?   Behavior: Behavior is cooperative.  ? ? ? ?UC Treatments / Results  ?Labs ?(all  labs ordered are listed, but only abnormal results are displayed) ?Labs Reviewed - No data to display ? ?EKG ? ? ?Radiology ?No results found. ? ?Procedures ?Procedures (including critical care time) ? ?Medications Ordered in UC ?Medications - No data to display ? ?Initial Impression / Assessment and Plan / UC Course  ?I have reviewed the triage vital signs and the nursing notes. ? ?Pertinent labs & imaging results that were available during my care of the patient were reviewed by me and considered in my medical decision making (see chart for details). ? ?  ? ?Dental infection noted on exam.  Started on Augmentin twice daily.  He can continue Tylenol and ibuprofen for fever and pain.  Recommended follow-up with dentist was given contact information for local provider.  Discussed that if he has any worsening symptoms including fever not responding to medication, swelling of throat, difficulty swallowing, shortness of breath he needs to go to the emergency room.  Strict return precautions given to which he expressed understanding.  Work excuse note provided. ? ?Final Clinical Impressions(s) / UC Diagnoses  ? ?Final diagnoses:  ?Dental abscess  ?Dental infection  ? ? ? ?Discharge Instructions   ? ?  ?Start Augmentin twice daily to cover for infection.  Take this with food as it will upset your stomach.  Use Tylenol ibuprofen for pain.  Follow-up with dentist as we discussed.  If you have any difficulty swallowing, swelling of your throat, high fever not responding to medication, muffled voice you need to go to the emergency room. ? ? ? ? ?ED Prescriptions   ? ? Medication Sig Dispense Auth. Provider  ? amoxicillin-clavulanate (AUGMENTIN) 875-125 MG tablet Take 1 tablet by mouth every 12 (twelve)  hours for 10 days. 20 tablet Deakon Frix K, PA-C  ? ?  ? ?PDMP not reviewed this encounter. ?  ?Jeani Hawking, PA-C ?10/30/21 1836 ? ?

## 2021-10-30 NOTE — ED Triage Notes (Signed)
Pt reports right side dental pain x 1 week. Pt states he believes there is an abscess there. States he also had a fever earlier and has been taken OTC ibuprofen.  ?

## 2021-10-30 NOTE — Discharge Instructions (Signed)
Start Augmentin twice daily to cover for infection.  Take this with food as it will upset your stomach.  Use Tylenol ibuprofen for pain.  Follow-up with dentist as we discussed.  If you have any difficulty swallowing, swelling of your throat, high fever not responding to medication, muffled voice you need to go to the emergency room. ?

## 2022-02-24 IMAGING — DX DG CHEST 1V PORT
1 series · 1 of 1 positions shown · non-contrast
Comparison: 04/25/2012

CLINICAL DATA: cough, hemoptysis

EXAM:
PORTABLE CHEST 1 VIEW

[chest ap]
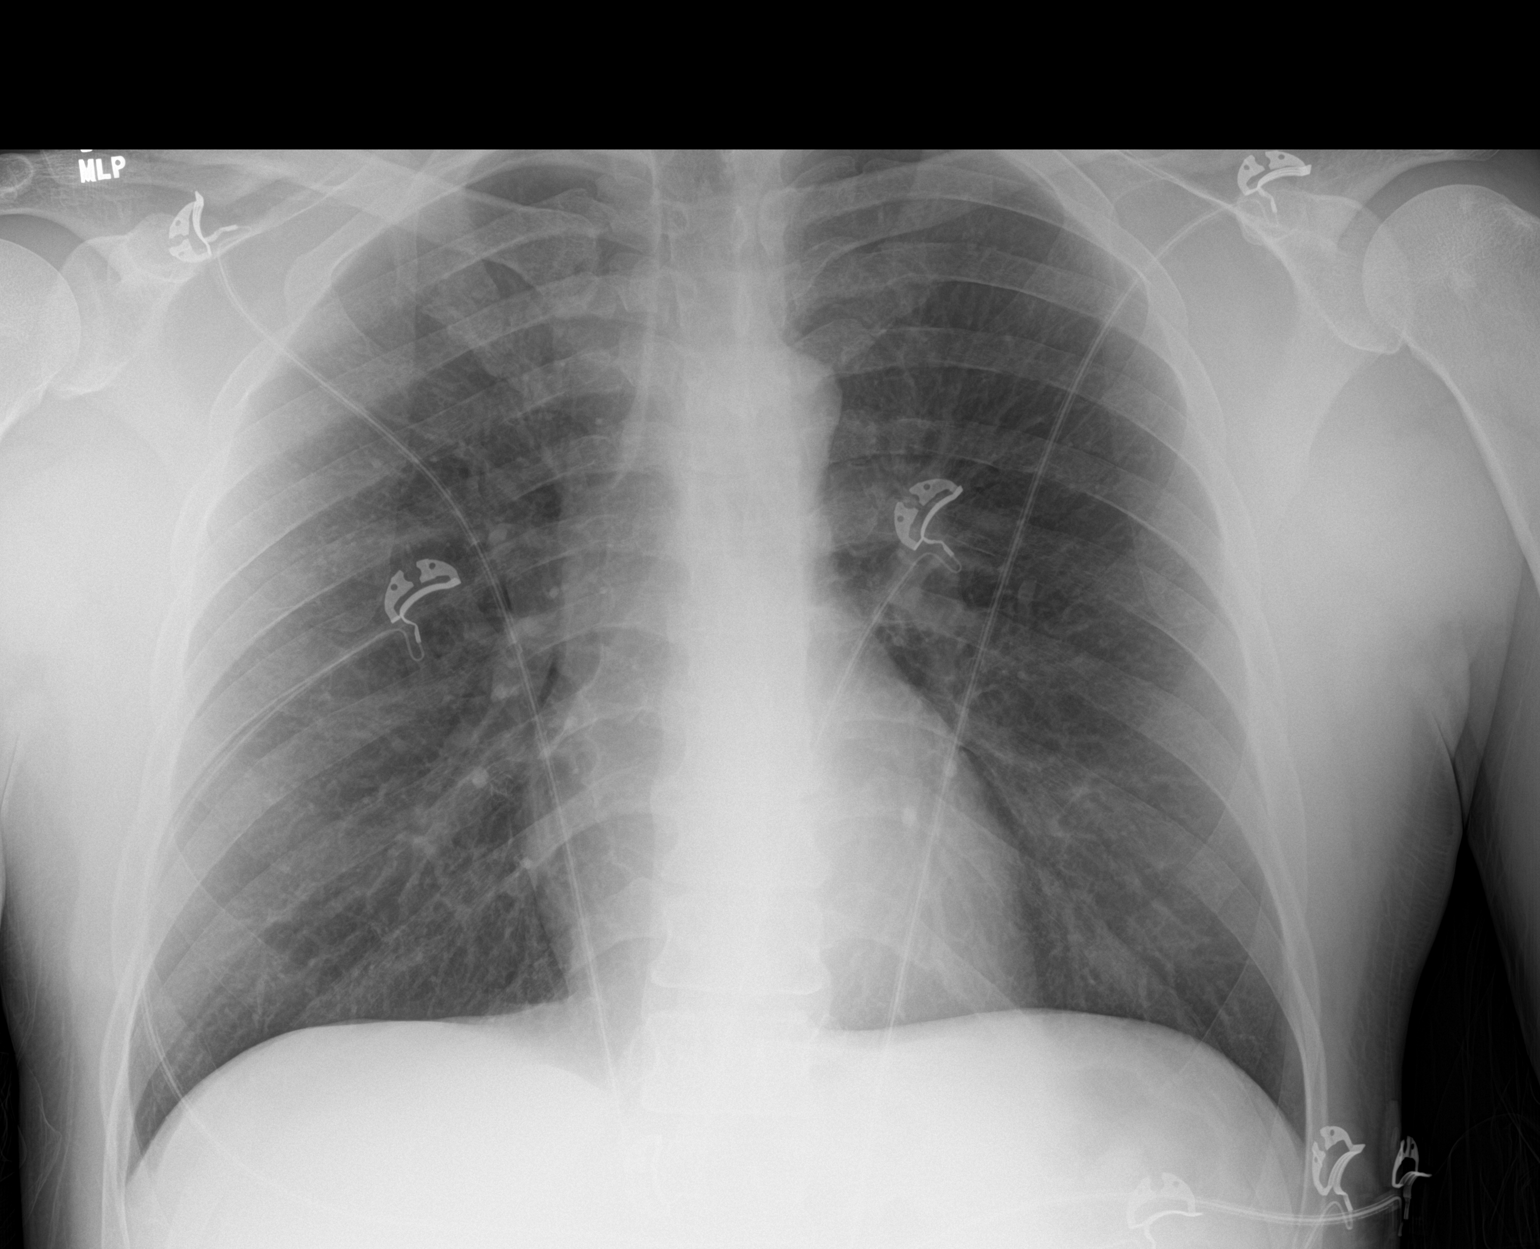

[1 of 1 positions shown; findings below may reference images not displayed]

FINDINGS: No focal consolidation. Mild peribronchial cuffing. No pneumothorax
or pleural effusion. Cardiomediastinal silhouette is within normal
limits. No acute osseous abnormality.
IMPRESSION: No focal consolidation.

Mild peribronchial cuffing may be seen in bronchitis or asthma.

## 2022-12-01 ENCOUNTER — Other Ambulatory Visit: Payer: Self-pay

## 2022-12-01 ENCOUNTER — Encounter (HOSPITAL_COMMUNITY): Payer: Self-pay

## 2022-12-01 ENCOUNTER — Emergency Department (HOSPITAL_COMMUNITY)
Admission: EM | Admit: 2022-12-01 | Discharge: 2022-12-01 | Disposition: A | Discharge status: Home / Self Care | Payer: Self-pay | Attending: Emergency Medicine | Admitting: Emergency Medicine

## 2022-12-01 DIAGNOSIS — J0100 Acute maxillary sinusitis, unspecified: Secondary | ICD-10-CM | POA: Insufficient documentation

## 2022-12-01 LAB — GROUP A STREP BY PCR: Group A Strep by PCR: NOT DETECTED

## 2022-12-01 MED ORDER — BENZONATATE 100 MG PO CAPS: 100.0000 mg | ORAL_CAPSULE | Freq: Three times a day (TID) | ORAL | 0 refills | Status: AC

## 2022-12-01 MED ORDER — AMOXICILLIN-POT CLAVULANATE 875-125 MG PO TABS: 1.0000 | ORAL_TABLET | Freq: Two times a day (BID) | ORAL | 0 refills | Status: AC

## 2022-12-01 MED ORDER — ONDANSETRON 4 MG PO TBDP: ORAL_TABLET | ORAL | 0 refills | Status: AC

## 2022-12-01 NOTE — ED Triage Notes (Signed)
C/o diarrhea, body aches, congestion, productive cough, runny nose, sore throat, headache, fever x1 week Reports son being treated for strep Robitussin and Thera flu w/o relief.

## 2022-12-01 NOTE — ED Provider Notes (Signed)
Zachary Lozano   CSN: [id number] Arrival date & time: 12/01/22  1002     History  Chief Complaint  Patient presents with   Sore Throat    Zachary Lozano is a 37 y.o. male.   Sore Throat  37 year old male presenting with 1 week of sore throat, congestion, diarrhea, chills, subjective fever (Tmax 99.5).  He reports his son was diagnosed with strep throat last week and is receiving antibiotics for this.  He notes he has done research and needs antibiotics as this will not resolve without.  He has not tested for COVID.  Endorses that he can taste how sick he is.     Home Medications Prior to Admission medications   Medication Sig Start Date End Date Taking? Authorizing Provider  amoxicillin-clavulanate (AUGMENTIN) 875-125 MG tablet Take 1 tablet by mouth every 12 (twelve) hours. 12/01/22  Yes Melene Plan, DO  benzonatate (TESSALON) 100 MG capsule Take 1 capsule (100 mg total) by mouth every 8 (eight) hours. 12/01/22  Yes Melene Plan, DO  ondansetron (ZOFRAN-ODT) 4 MG disintegrating tablet  ODT q4 hours prn nausea/vomit 12/01/22  Yes Melene Plan, DO  calcium carbonate (TUMS - DOSED IN MG ELEMENTAL CALCIUM) 500 MG chewable tablet Chew 5,000 mg by mouth 3 (three) times daily as needed for indigestion or heartburn.    [provider]  cyclobenzaprine (FLEXERIL) 10 MG tablet Take 1 tablet (10 mg total) by mouth 3 (three) times daily as needed for muscle spasms. 08/03/20   Roxy Horseman, PA-C  HYDROcodone-acetaminophen (NORCO/VICODIN) 5-325 MG tablet Take 1 tablet by mouth every 6 (six) hours as needed for moderate pain. 06/04/20   Vanetta Mulders, MD  hydrOXYzine (ATARAX/VISTARIL) 25 MG tablet Take 1 tablet (25 mg total) by mouth every 6 (six) hours. 07/26/20   Gailen Shelter, PA  ibuprofen (ADVIL) 800 MG tablet Take 1 tablet (800 mg total) by mouth 3 (three) times daily. 08/03/20   Roxy Horseman, PA-C  lidocaine  (LIDODERM) 5 % Place 1 patch onto the skin daily. Remove & Discard patch within 12 hours or as directed by MD 03/03/21   Sponseller, Eugene Gavia, PA-C  methocarbamol (ROBAXIN) 500 MG tablet Take 1 tablet (500 mg total) by mouth 2 (two) times daily. 03/03/21   Sponseller, Eugene Gavia, PA-C  nicotine (NICODERM CQ - DOSED IN MG/24 HOURS) 21 mg/24hr patch Place 1 patch (21 mg total) onto the skin daily. 07/26/20   Gailen Shelter, PA  predniSONE (DELTASONE) 10 MG tablet Take 4 tablets (40 mg total) by mouth daily. Patient not taking: No sig reported 06/04/20   Vanetta Mulders, MD      Allergies    Patient has no known allergies.    Review of Systems   Review of Systems  All other systems reviewed and are negative.  Physical Exam Updated Vital Signs BP (!) 127/90 (BP Location: Right Arm)   Pulse 96   Temp 98.3 F (36.8 C) (Oral)   Resp 16   Wt 81 kg   SpO2 100%   BMI 27.15 kg/m  Physical Exam Vitals and nursing Lozano reviewed.  Constitutional:      General: He is not in acute distress.    Appearance: He is well-developed.  HENT:     Right Ear: Tympanic membrane normal.     Left Ear: Tympanic membrane normal.     Nose: Congestion present.     Mouth/Throat:     Mouth: Mucous  membranes are moist.     Pharynx: Oropharynx is clear. No pharyngeal swelling or oropharyngeal exudate.     Tonsils: No tonsillar exudate.  Cardiovascular:     Rate and Rhythm: Normal rate and regular rhythm.     Heart sounds: Normal heart sounds.  Pulmonary:     Effort: Pulmonary effort is normal.     Breath sounds: Normal breath sounds.  Abdominal:     General: Bowel sounds are normal.     Palpations: Abdomen is soft.  Lymphadenopathy:     Cervical: Cervical adenopathy present.  Neurological:     General: No focal deficit present.     Mental Status: He is alert and oriented to person, place, and time.    ED Results / Procedures / Treatments   Labs (all labs ordered are listed, but only abnormal  results are displayed) Labs Reviewed  GROUP A STREP BY PCR  SARS CORONAVIRUS 2 BY RT PCR    EKG None  Radiology No results found.  Procedures Procedures    Medications Ordered in ED Medications - No data to display  ED Course/ Medical Decision Making/ A&P Click here for ABCD2, HEART and other calculatorsREFRESH Lozano before signing :1}                          Medical Decision Making Differential diagnosis consist of viral versus bacterial pharyngitis, viral upper respiratory illness (flu, COVID, etc.), pneumonia, gastroenteritis, sinusitis.  Physical exam unremarkable patient appears well.  Strep A PCR negative. Pt refused COVID test. Rx for augmentin sent for bacterial sinusitis.  Follow-up with PCP or return if not improving.  Risk Prescription drug management.        Final Clinical Impression(s) / ED Diagnoses Final diagnoses:  Acute maxillary sinusitis, recurrence not specified    Rx / DC Orders ED Discharge Orders          Ordered    amoxicillin-clavulanate (AUGMENTIN) 875-125 MG tablet  Every 12 hours        12/01/22 1136    benzonatate (TESSALON) 100 MG capsule  Every 8 hours        12/01/22 1136    ondansetron (ZOFRAN-ODT) 4 MG disintegrating tablet        12/01/22 1136              [name], DO 12/01/22 1228    Melene Plan, DO 12/01/22 1424

## 2022-12-01 NOTE — ED Notes (Signed)
Pt refused COVID test 

## 2022-12-01 NOTE — Discharge Instructions (Signed)
Take tylenol 2 pills 4 times a day and motrin 4 pills 3 times a day.  Drink plenty of fluids.  Return for worsening shortness of breath, headache, confusion. Follow up with your family doctor.   

## 2023-01-11 IMAGING — CT CT HEAD W/O CM
4 series · 17 of 47 positions shown, 19 images · non-contrast
Comparison: None.

CLINICAL DATA: Headache, new or worsening, post traumatic (Age
19-49y)

EXAM:
CT HEAD WITHOUT CONTRAST
TECHNIQUE: Contiguous axial images were obtained from the base of the skull
through the vertex without intravenous contrast.

[Series 3: head without · axial · non-contrast · 0.43mm/px · z∈[-67,+53]mm · 6 of 34 slices shown, 8 images]
[im 5/34  brain]
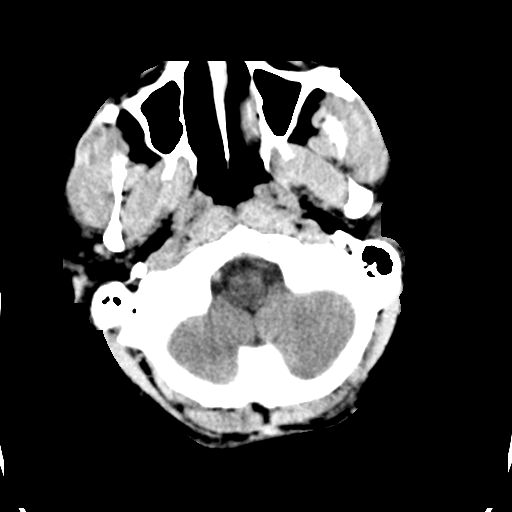
[im 5/34  bone]
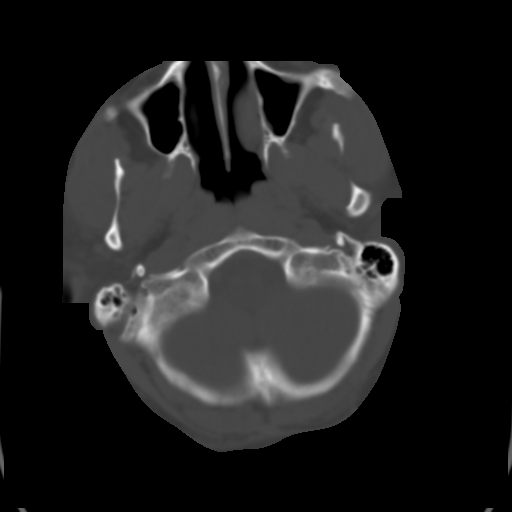
[im 10/34  brain]
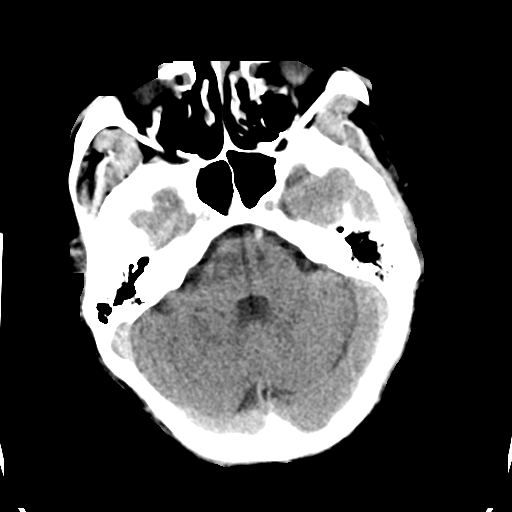
[im 15/34  brain]
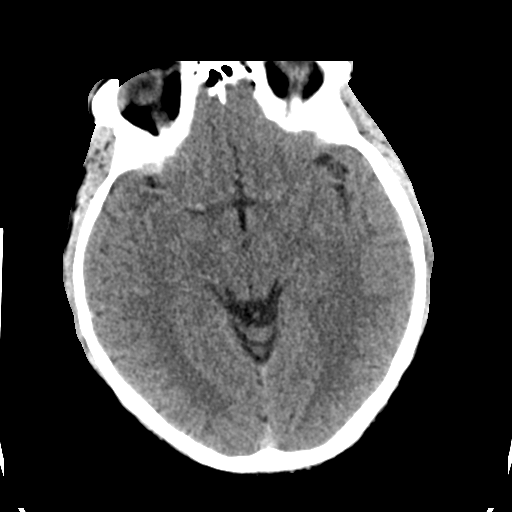
[im 19/34  brain]
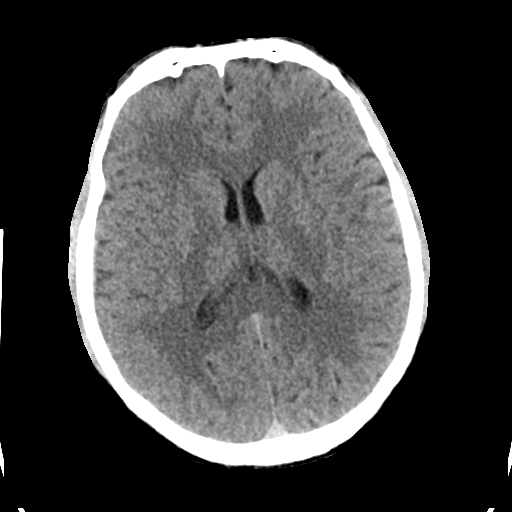
[im 24/34  brain]
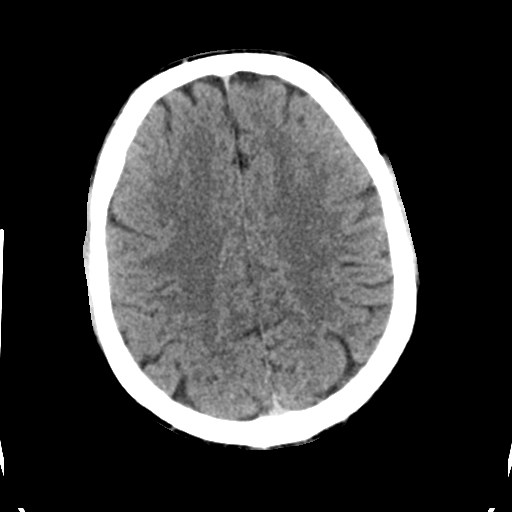
[im 24/34  bone]
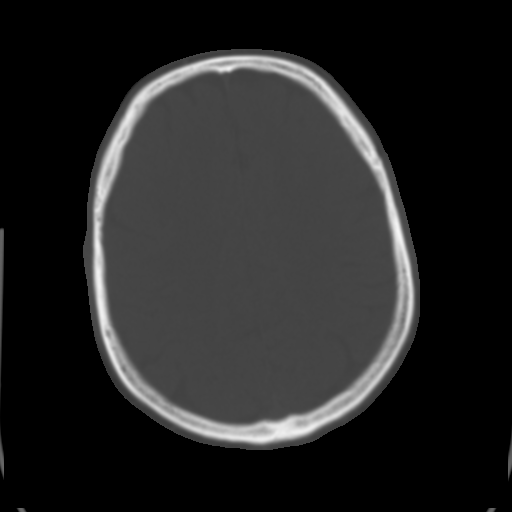
[im 29/34  brain]
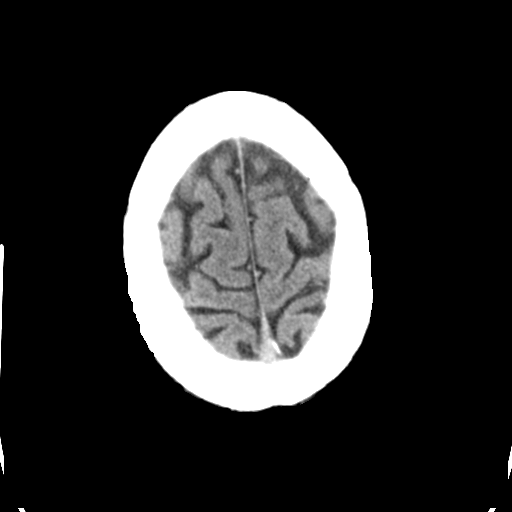

[Series 4: head bone · axial · 0.43mm/px · z∈[-71,+9]mm · 5 of 86 slices shown]
[im 9/86  bone]
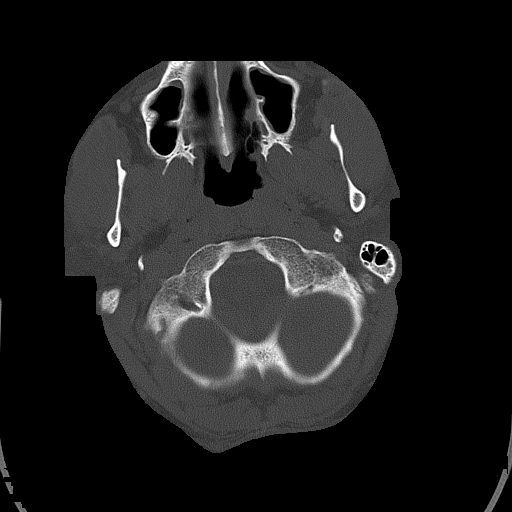
[im 17/86  bone]
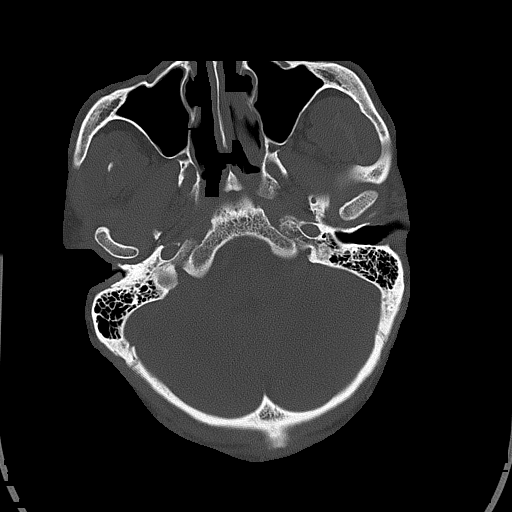
[im 29/86  bone]
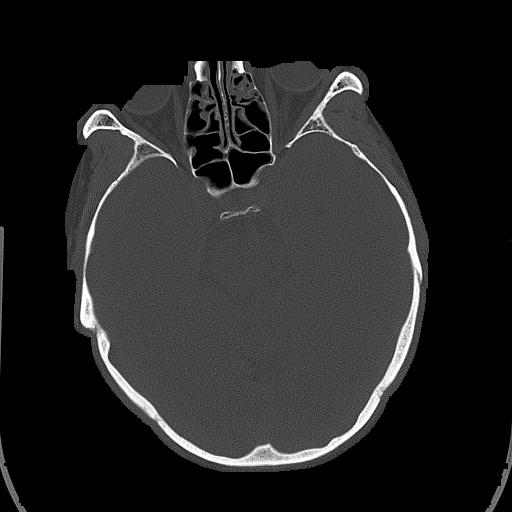
[im 37/86  bone]
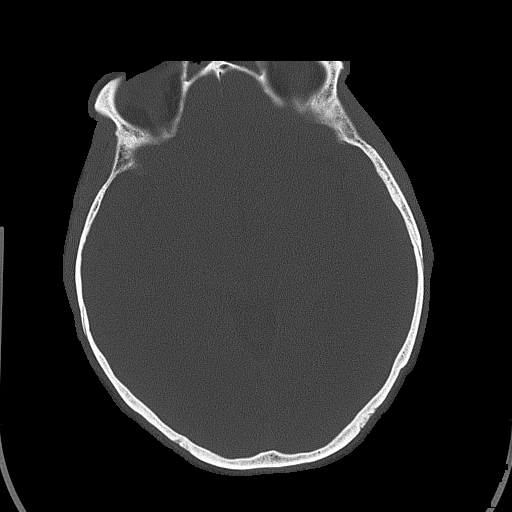
[im 49/86  bone]
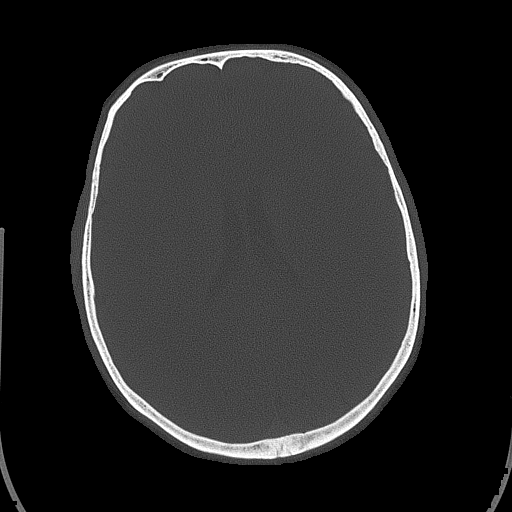

[Series 5: head without cor · coronal · non-contrast · 0.33mm/px · 3 of 68 slices shown]
[im 23/68  brain]
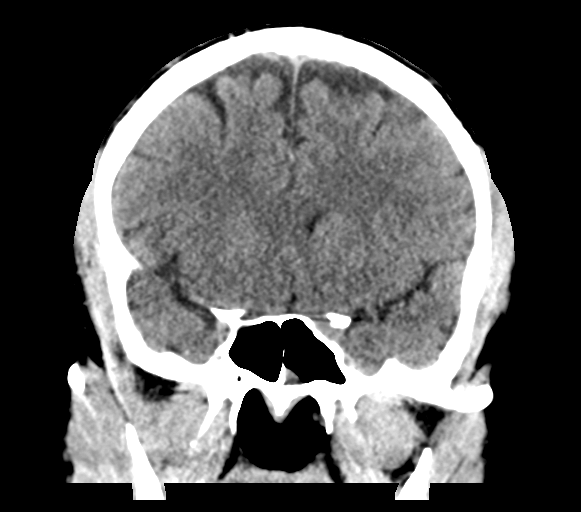
[im 30/68  brain]
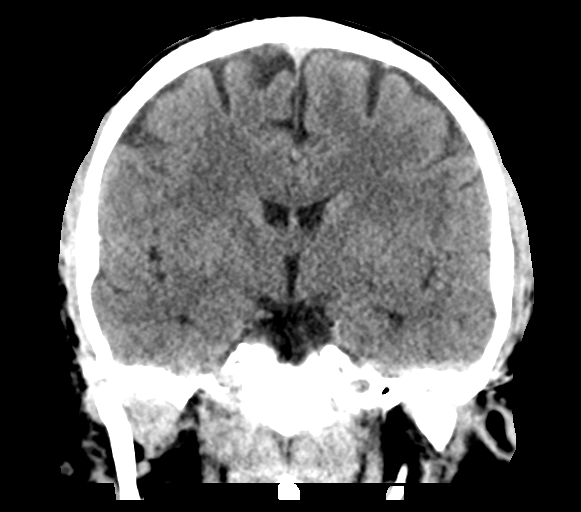
[im 38/68  brain]
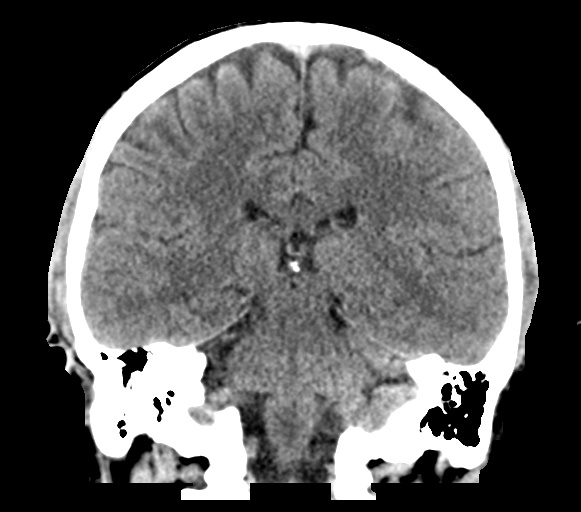

[Series 6: head without sag · sagittal · non-contrast · 0.33mm/px · 3 of 61 slices shown]
[im 21/61  brain]
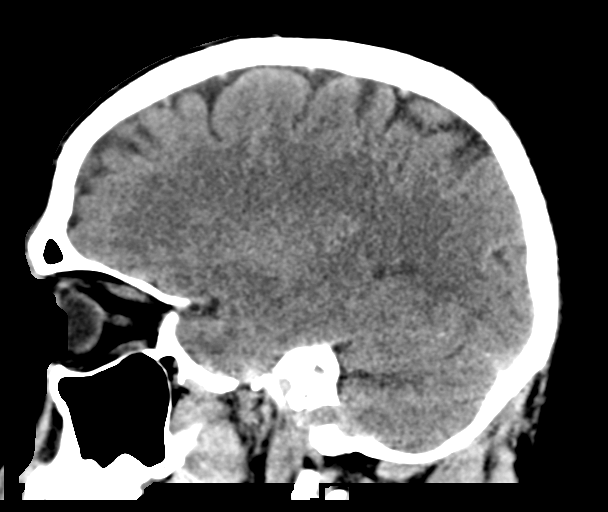
[im 31/61  brain]
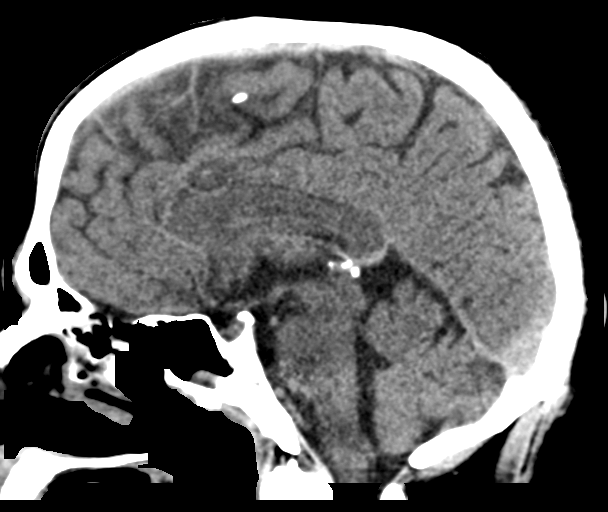
[im 41/61  brain]
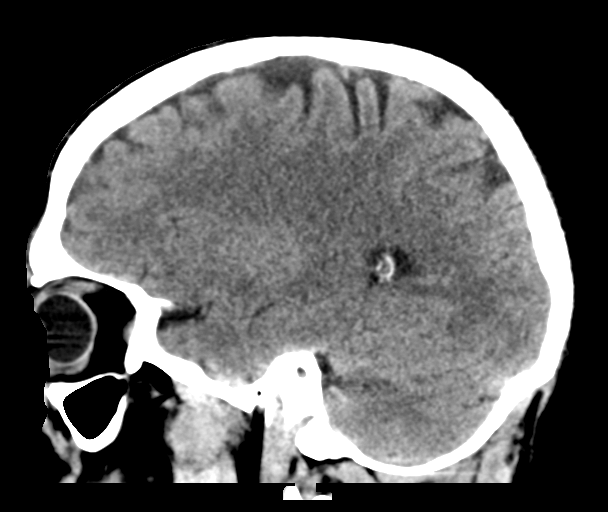

[17 of 47 positions shown; findings below may reference images not displayed]

FINDINGS: Brain: There is no acute intracranial hemorrhage, mass effect, or
edema. Gray-white differentiation is preserved. There is no
extra-axial fluid collection. Ventricles and sulci are within normal
limits in size and configuration.

Vascular: No hyperdense vessel or unexpected calcification.

Skull: Calvarium is unremarkable.

Sinuses/Orbits: No acute finding.

Other: None.
IMPRESSION: No evidence acute traumatic injury.

## 2023-04-29 ENCOUNTER — Emergency Department (HOSPITAL_COMMUNITY)
Admission: EM | Admit: 2023-04-29 | Discharge: 2023-04-29 | Disposition: A | Discharge status: Home / Self Care | Payer: Self-pay | Attending: Emergency Medicine | Admitting: Emergency Medicine

## 2023-04-29 ENCOUNTER — Other Ambulatory Visit: Payer: Self-pay

## 2023-04-29 ENCOUNTER — Encounter (HOSPITAL_COMMUNITY): Payer: Self-pay | Admitting: Emergency Medicine

## 2023-04-29 DIAGNOSIS — U071 COVID-19: Secondary | ICD-10-CM | POA: Insufficient documentation

## 2023-04-29 LAB — RESP PANEL BY RT-PCR (RSV, FLU A&B, COVID)  RVPGX2
Influenza A by PCR: NEGATIVE
Influenza B by PCR: NEGATIVE
Resp Syncytial Virus by PCR: NEGATIVE
SARS Coronavirus 2 by RT PCR: POSITIVE — AB

## 2023-04-29 MED ORDER — ACETAMINOPHEN 500 MG PO TABS
1000.0000 mg | ORAL_TABLET | Freq: Once | ORAL | Status: AC
  Administered 2023-04-29: 1000 mg via ORAL
  Filled 2023-04-29: qty 2

## 2023-04-29 MED ORDER — BENZONATATE 100 MG PO CAPS
200.0000 mg | ORAL_CAPSULE | Freq: Once | ORAL | Status: AC
  Administered 2023-04-29: 200 mg via ORAL
  Filled 2023-04-29: qty 2

## 2023-04-29 NOTE — ED Triage Notes (Signed)
Patient presents with Covid like symptoms. He complains of chills, weakness and fevers. Symptoms started yesterday.

## 2023-04-29 NOTE — ED Provider Notes (Signed)
EMERGENCY DEPARTMENT AT [facility] Provider Note   CSN: [id number] Arrival date & time: 04/29/23  1258     History  Chief Complaint  Patient presents with   Generalized Body Aches   Fever    ESTANISLADO SCORE is a 37 y.o. male with medical history of tooth extraction.  The patient presents to the ED for evaluation of bodyaches and chills, fever, sore throat, back pain.  Patient reports that all symptoms began this morning.  He states over the last 1 week he has had progressively worsening fatigue and malaise.  He states that he woke up this morning with acute onset of the above-stated symptoms.  Denies nausea, vomiting, abdominal pain, diarrhea, trouble swallowing, shortness of breath.  He is endorsing a cough.  Denies any known sick contacts.  States he took the medication this morning but cannot tell her the name of it.   Fever Associated symptoms: chills, cough, myalgias and sore throat   Associated symptoms: no chest pain, no diarrhea, no nausea and no vomiting        Home Medications Prior to Admission medications   Medication Sig Start Date End Date Taking? Authorizing Provider  amoxicillin-clavulanate (AUGMENTIN) 875-125 MG tablet Take 1 tablet by mouth every 12 (twelve) hours. 12/01/22   Melene Plan, DO  benzonatate (TESSALON) 100 MG capsule Take 1 capsule (100 mg total) by mouth every 8 (eight) hours. 12/01/22   Melene Plan, DO  calcium carbonate (TUMS - DOSED IN MG ELEMENTAL CALCIUM) 500 MG chewable tablet Chew 5,000 mg by mouth 3 (three) times daily as needed for indigestion or heartburn.    [provider]  cyclobenzaprine (FLEXERIL) 10 MG tablet Take 1 tablet (10 mg total) by mouth 3 (three) times daily as needed for muscle spasms. 08/03/20   Roxy Horseman, PA-C  HYDROcodone-acetaminophen (NORCO/VICODIN) 5-325 MG tablet Take 1 tablet by mouth every 6 (six) hours as needed for moderate pain. 06/04/20   Vanetta Mulders, MD  hydrOXYzine  (ATARAX/VISTARIL) 25 MG tablet Take 1 tablet (25 mg total) by mouth every 6 (six) hours. 07/26/20   Gailen Shelter, PA  ibuprofen (ADVIL) 800 MG tablet Take 1 tablet (800 mg total) by mouth 3 (three) times daily. 08/03/20   Roxy Horseman, PA-C  lidocaine (LIDODERM) 5 % Place 1 patch onto the skin daily. Remove & Discard patch within 12 hours or as directed by MD 03/03/21   Sponseller, Eugene Gavia, PA-C  methocarbamol (ROBAXIN) 500 MG tablet Take 1 tablet (500 mg total) by mouth 2 (two) times daily. 03/03/21   Sponseller, Eugene Gavia, PA-C  nicotine (NICODERM CQ - DOSED IN MG/24 HOURS) 21 mg/24hr patch Place 1 patch (21 mg total) onto the skin daily. 07/26/20   Gailen Shelter, PA  ondansetron (ZOFRAN-ODT) 4 MG disintegrating tablet 4mg  ODT q4 hours prn nausea/vomit 12/01/22   Melene Plan, DO  predniSONE (DELTASONE) 10 MG tablet Take 4 tablets (40 mg total) by mouth daily. Patient not taking: No sig reported 06/04/20   Vanetta Mulders, MD      Allergies    Patient has no known allergies.    Review of Systems   Review of Systems  Constitutional:  Positive for chills and fever.  HENT:  Positive for sore throat.   Respiratory:  Positive for cough. Negative for shortness of breath.   Cardiovascular:  Negative for chest pain.  Gastrointestinal:  Negative for abdominal pain, diarrhea, nausea and vomiting.  Musculoskeletal:  Positive for myalgias.  All other systems reviewed and are negative.   Physical Exam Updated Vital Signs BP 122/77   Pulse 94   Temp 99.5 F (37.5 C) (Oral)   Resp (!) 24   SpO2 98%  Physical Exam Vitals and nursing note reviewed.  Constitutional:      General: He is not in acute distress.    Appearance: Normal appearance. He is not ill-appearing, toxic-appearing or diaphoretic.  HENT:     Head: Normocephalic and atraumatic.     Nose: Nose normal.     Mouth/Throat:     Mouth: Mucous membranes are moist.     Pharynx: Oropharynx is clear. Posterior oropharyngeal  erythema present. No oropharyngeal exudate.     Comments: Uvula midline, handling secretions appropriately, no drooling, no change phonation Eyes:     Extraocular Movements: Extraocular movements intact.     Conjunctiva/sclera: Conjunctivae normal.     Pupils: Pupils are equal, round, and reactive to light.  Cardiovascular:     Rate and Rhythm: Normal rate and regular rhythm.  Pulmonary:     Effort: Pulmonary effort is normal.     Breath sounds: Normal breath sounds. No wheezing.  Abdominal:     General: Abdomen is flat. Bowel sounds are normal.     Palpations: Abdomen is soft.     Tenderness: There is no abdominal tenderness.  Musculoskeletal:     Cervical back: Normal range of motion and neck supple. No tenderness.  Skin:    General: Skin is warm and dry.     Capillary Refill: Capillary refill takes less than 2 seconds.  Neurological:     Mental Status: He is alert and oriented to person, place, and time.     ED Results / Procedures / Treatments   Labs (all labs ordered are listed, but only abnormal results are displayed) Labs Reviewed  RESP PANEL BY RT-PCR (RSV, FLU A&B, COVID)  [id number] - Abnormal; Notable for the following components:      Result Value   SARS Coronavirus 2 by RT PCR POSITIVE (*)    All other components within normal limits    EKG None  Radiology No results found.  Procedures Procedures   Medications Ordered in ED Medications  acetaminophen (TYLENOL) tablet 1,000 mg (1,000 mg Oral Given 04/29/23 1350)  benzonatate (TESSALON) capsule 200 mg (200 mg Oral Given 04/29/23 1350)    ED Course/ Medical Decision Making/ A&P  Medical Decision Making  37 year old male presents to the ED for evaluation.  Please see HPI for further details.  On examination the patient is afebrile and nontachycardic.  Lung sounds are good bilaterally, nonhypoxic.  Abdomen is soft and compressible throughout.  Neurological examination at baseline.  Posterior oropharynx is  erythematous without exudate.  Uvula midline, handling secretions appropriately, no drooling, no change in phonation.  Patient can Tylenol and Tessalon Perles for cough.  Patient viral panel is positive for COVID-19.  This is the cause of patient symptoms most likely.  Patient has been advised to treat symptoms conservatively at home, purchase Zicam.  Encouraged to push fluids.  Was written out of work.  Return precautions provided and he voiced understanding.  He is stable to discharge home.   Final Clinical Impression(s) / ED Diagnoses Final diagnoses:  COVID-19    Rx / DC Orders ED Discharge Orders     None         Al Decant, PA-C 04/29/23 1508    Rozelle Logan, DO 04/29/23 1656

## 2023-04-29 NOTE — Discharge Instructions (Signed)
It was a pleasure taking part in your care today.  As we discussed, you have COVID-19.  Please take ibuprofen for body aches and chills, take Tylenol for headaches and fevers.  Please purchase Zicam which is over-the-counter, this will help shorten duration of your symptoms.  Please continue to push fluid such as water, Body Armor.  Please read attached guides concerning COVID-19 and quarantine and isolation.  Please see the attached work note.  Please return to the ED with any new or worsening signs or symptoms.

## 2023-04-29 NOTE — ED Notes (Signed)
PA @ bedside.

## 2024-02-23 ENCOUNTER — Encounter (HOSPITAL_BASED_OUTPATIENT_CLINIC_OR_DEPARTMENT_OTHER): Payer: Self-pay | Admitting: Student

## 2024-02-23 ENCOUNTER — Other Ambulatory Visit (HOSPITAL_BASED_OUTPATIENT_CLINIC_OR_DEPARTMENT_OTHER): Payer: Self-pay | Admitting: Student

## 2024-02-23 ENCOUNTER — Ambulatory Visit (INDEPENDENT_AMBULATORY_CARE_PROVIDER_SITE_OTHER): Admitting: Student

## 2024-02-23 DIAGNOSIS — M25511 Pain in right shoulder: Secondary | ICD-10-CM
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
## 2024-02-26 ENCOUNTER — Inpatient Hospital Stay: Admission: RE | Admit: 2024-02-26 | Source: Ambulatory Visit

## 2024-04-15 ENCOUNTER — Emergency Department
Admission: EM | Admit: 2024-04-15 | Discharge: 2024-04-15 | Disposition: A | Discharge status: Home / Self Care | Attending: Emergency Medicine | Admitting: Emergency Medicine

## 2024-04-15 ENCOUNTER — Other Ambulatory Visit: Payer: Self-pay

## 2024-04-15 DIAGNOSIS — R799 Abnormal finding of blood chemistry, unspecified: Secondary | ICD-10-CM | POA: Diagnosis not present

## 2024-04-15 DIAGNOSIS — L03116 Cellulitis of left lower limb: Secondary | ICD-10-CM | POA: Diagnosis not present

## 2024-04-15 DIAGNOSIS — M7989 Other specified soft tissue disorders: Secondary | ICD-10-CM | POA: Diagnosis present

## 2024-04-15 LAB — COMPREHENSIVE METABOLIC PANEL WITH GFR
ALT: 34 U/L (ref 0–44)
AST: 26 U/L (ref 15–41)
Albumin: 4.1 g/dL (ref 3.5–5.0)
Alkaline Phosphatase: 65 U/L (ref 38–126)
Anion gap: 7 (ref 5–15)
BUN: 23 mg/dL — ABNORMAL HIGH (ref 6–20)
CO2: 24 mmol/L (ref 22–32)
Calcium: 9.2 mg/dL (ref 8.9–10.3)
Chloride: 103 mmol/L (ref 98–111)
Creatinine, Ser: 0.93 mg/dL (ref 0.61–1.24)
GFR, Estimated: >60 mL/min (ref >60)
Glucose, Bld: 97 mg/dL (ref 70–99)
Potassium: 3.7 mmol/L (ref 3.5–5.1)
Sodium: 134 mmol/L — ABNORMAL LOW (ref 135–145)
Total Bilirubin: 0.9 mg/dL (ref 0.0–1.2)
Total Protein: 7.3 g/dL (ref 6.5–8.1)

## 2024-04-15 LAB — CBC WITH DIFFERENTIAL/PLATELET
Abs Immature Granulocytes: 0.06 K/uL (ref 0.00–0.07)
Basophils Absolute: 0 K/uL (ref 0.0–0.1)
Basophils Relative: 0 %
Eosinophils Absolute: 0.3 K/uL (ref 0.0–0.5)
Eosinophils Relative: 2 %
HCT: 40.6 % (ref 39.0–52.0)
Hemoglobin: 14.2 g/dL (ref 13.0–17.0)
Immature Granulocytes: 0 %
Lymphocytes Relative: 12 %
Lymphs Abs: 1.9 K/uL (ref 0.7–4.0)
MCH: 31 pg (ref 26.0–34.0)
MCHC: 35 g/dL (ref 30.0–36.0)
MCV: 88.6 fL (ref 80.0–100.0)
Monocytes Absolute: 1.4 K/uL — ABNORMAL HIGH (ref 0.1–1.0)
Monocytes Relative: 9 %
Neutro Abs: 12.1 K/uL — ABNORMAL HIGH (ref 1.7–7.7)
Neutrophils Relative %: 77 %
Platelets: 308 K/uL (ref 150–400)
RBC: 4.58 MIL/uL (ref 4.22–5.81)
RDW: 12.3 % (ref 11.5–15.5)
WBC: 15.7 K/uL — ABNORMAL HIGH (ref 4.0–10.5)
nRBC: 0 % (ref 0.0–0.2)

## 2024-04-15 LAB — LACTIC ACID, PLASMA: Lactic Acid, Venous: 0.8 mmol/L (ref 0.5–1.9)

## 2024-04-15 LAB — CBG MONITORING, ED: Glucose-Capillary: 113 mg/dL — ABNORMAL HIGH (ref 70–99)

## 2024-04-15 MED ORDER — KETOROLAC TROMETHAMINE 30 MG/ML IJ SOLN
30.0000 mg | Freq: Once | INTRAMUSCULAR | Status: AC
  Administered 2024-04-15: 30 mg via INTRAVENOUS
  Filled 2024-04-15: qty 1

## 2024-04-15 MED ORDER — CEPHALEXIN 500 MG PO CAPS: 500.0000 mg | ORAL_CAPSULE | Freq: Four times a day (QID) | ORAL | 0 refills | Status: AC

## 2024-04-15 MED ORDER — SODIUM CHLORIDE 0.9 % IV SOLN
2.0000 g | Freq: Once | INTRAVENOUS | Status: AC
  Administered 2024-04-15: 2 g via INTRAVENOUS
  Filled 2024-04-15: qty 20

## 2024-04-15 MED ORDER — LACTATED RINGERS IV BOLUS
1000.0000 mL | Freq: Once | INTRAVENOUS | Status: AC
  Administered 2024-04-15: 1000 mL via INTRAVENOUS

## 2024-04-15 MED ORDER — IBUPROFEN 600 MG PO TABS: 600.0000 mg | ORAL_TABLET | Freq: Four times a day (QID) | ORAL | 0 refills | Status: AC | PRN

## 2024-04-15 NOTE — ED Provider Notes (Signed)
 [facility] Provider Note    Event Date/Time   First MD Initiated Contact with Patient 04/15/24 2052     (approximate)   History   Insect Bite   HPI  Zachary Lozano is a 38 y.o. male with a history of ADHD who presents with swelling, redness, and pain to his left lower leg, acute onset today.  The patient states that he had a small wound that he thought might have been a bite from an insect.  Subsequently he developed redness and swelling to the back of his left calf.  There is no pain above the knee.  He did have some chills and malaise.  While in the waiting room he started to feel lightheaded and briefly had a syncopal episode.  He states he is feeling somewhat better now although feels tired and weak.  He denies any injury to the leg.  He has no numbness or weakness.  I reviewed the past medical records.  The patient's most recent outpatient encounters with with orthopedics on 7/9 for follow-up of her right shoulder injury and with internal medicine on 6/16 for ADHD management.   Physical Exam   Triage Vital Signs: ED Triage Vitals  Encounter Vitals Group     BP 04/15/24 1915 137/83     Girls Systolic BP Percentile --      Girls Diastolic BP Percentile --      Boys Systolic BP Percentile --      Boys Diastolic BP Percentile --      Pulse Rate 04/15/24 1915 (!) 102     Resp 04/15/24 1915 19     Temp 04/15/24 1915 99.3 F (37.4 C)     Temp Source 04/15/24 1915 Oral     SpO2 04/15/24 1915 99 %     Weight 04/15/24 1918 178 lb (80.7 kg)     Height --      Head Circumference --      Peak Flow --      Pain Score 04/15/24 1918 8     Pain Loc --      Pain Education --      Exclude from Growth Chart --     Most recent vital signs: Vitals:   04/15/24 2211 04/15/24 2300  BP: 123/73 117/73  Pulse: 84 94  Resp: 16 16  Temp:    SpO2: 97% 93%     General: Awake, no distress.  CV:  Good peripheral perfusion.  Resp:  Normal effort.  Abd:  No  distention.  Other:  Approximately 15 cm area of erythema, induration, and warmth to the posterior left leg with an approximately 5 mm superficial appearing abrasion or laceration in the middle of it.  No fluctuance.  There is no tenderness or swelling to the popliteal area or proximal leg.  He has full range of motion at the knee.     ED Results / Procedures / Treatments   Labs (all labs ordered are listed, but only abnormal results are displayed) Labs Reviewed  COMPREHENSIVE METABOLIC PANEL WITH GFR - Abnormal; Notable for the following components:      Result Value   Sodium 134 (*)    BUN 23 (*)    All other components within normal limits  CBC WITH DIFFERENTIAL/PLATELET - Abnormal; Notable for the following components:   WBC 15.7 (*)    Neutro Abs 12.1 (*)    Monocytes Absolute 1.4 (*)    All other components within normal  limits  CBG MONITORING, ED - Abnormal; Notable for the following components:   Glucose-Capillary 113 (*)    All other components within normal limits  CULTURE, BLOOD (ROUTINE X 2)  CULTURE, BLOOD (ROUTINE X 2)  LACTIC ACID, PLASMA     EKG  ED ECG REPORT I, Waylon Cassis, the attending physician, personally viewed and interpreted this ECG.  Date: 04/15/2024 EKG Time: 2136 Rate: 87 Rhythm: normal sinus rhythm QRS Axis: normal Intervals: normal ST/T Wave abnormalities: Early repolarization Narrative Interpretation: no evidence of acute ischemia    RADIOLOGY    PROCEDURES:  Critical Care performed: No  Procedures   MEDICATIONS ORDERED IN ED: Medications  cefTRIAXone  (ROCEPHIN ) 2 g in sodium chloride  0.9 % 100 mL IVPB (0 g Intravenous Stopped 04/15/24 2218)  lactated ringers  bolus 1,000 mL (0 mLs Intravenous Stopped 04/15/24 2329)  ketorolac  (TORADOL ) 30 MG/ML injection 30 mg (30 mg Intravenous Given 04/15/24 2146)     IMPRESSION / MDM / ASSESSMENT AND PLAN / ED COURSE  I reviewed the triage vital signs and the nursing  notes.  38 year old male with PMH as noted above presents with acute onset of redness swelling and induration to the posterior left calf with some systemic symptoms.  He had a brief syncopal episode while in the waiting room.  On exam he has a borderline elevated temperature.  He had a transient low blood pressure reading but this has resolved.  There is a localized area of redness and swelling to the left posterior calf.  Differential diagnosis includes, but is not limited to, cellulitis.  There is no evidence of abscess.  The presentation is not consistent with a DVT given the localized nature of the redness and swelling.  CMP shows no acute findings.  CBC shows leukocytosis.  Lactate is negative.  Will obtain blood cultures, give a dose of IV ceftriaxone , a liter of fluid, and reassess.  Patient's presentation is most consistent with acute presentation with potential threat to life or bodily function.  ----------------------------------------- 12:17 AM on 04/16/2024 -----------------------------------------  The patient states he is feeling significantly better.  His blood pressure has remained stable since his initial near syncopal episode shortly before he was brought back to room.  Given the relatively rapid progression of the patient's cellulitis as well as his systemic symptoms I did consider inpatient admission, and I offered this to the patient.  However, he declines.  He states he would strongly prefer to go home.  Given that his vital signs have been stable, lactate is normal, and there is no evidence of sepsis, I feel that this is reasonable.  I have prescribed Keflex .  There is no evidence of MRSA.  I gave strict return precautions and he expressed understanding.   FINAL CLINICAL IMPRESSION(S) / ED DIAGNOSES   Final diagnoses:  Left leg cellulitis     Rx / DC Orders   ED Discharge Orders          Ordered    ibuprofen  (ADVIL ) 600 MG tablet  Every 6 hours PRN        04/15/24  2326    cephALEXin  (KEFLEX ) 500 MG capsule  4 times daily        04/15/24 2326             Note:  This document was prepared using Dragon voice recognition software and may include unintentional dictation errors.    Cassis Waylon, MD 04/16/24 [phone]

## 2024-04-15 NOTE — Discharge Instructions (Signed)
 Take the antibiotic as prescribed and finish the full course.  Keep a close eye on your leg.  The redness and swelling should start to get better over the next 1 to 2 days.  Return to the ER immediately for new, worsening, or persistent redness, if you start to have redness or pain spreading up the leg, continued fever or chills, generalized weakness, lightheadedness, feel like you are going to pass out, or any other new or worsening symptoms that concern you.

## 2024-04-15 NOTE — ED Notes (Addendum)
 Pt became diaphoretic, pale, and n/v while sitting in the waiting area. Pt was taken to triage room for reassessment and pts BP was 89/52. Pt reports dizziness and generalized weakness.

## 2024-04-15 NOTE — ED Triage Notes (Signed)
 Pt arrives with insect bite. Pt reports noticing the bite on his left calf this afternoon. Pt has redness and swelling extending from his ankle to his knee on the left side of calf. It is also warm and firm to the touch.

## 2024-04-20 LAB — CULTURE, BLOOD (ROUTINE X 2)
Culture: NO GROWTH
Culture: NO GROWTH
Special Requests: ADEQUATE
Special Requests: ADEQUATE
